# Patient Record
Sex: Female | Born: 2000 | Race: White | Hispanic: No | Marital: Single | Smoking: Never smoker
Health system: Southern US, Community
[De-identification: ages and names within clinical notes are randomized; demographics above are authoritative.]

## PROBLEM LIST (undated history)

## (undated) DIAGNOSIS — Z789 Other specified health status: Secondary | ICD-10-CM

## (undated) DIAGNOSIS — F909 Attention-deficit hyperactivity disorder, unspecified type: Secondary | ICD-10-CM

## (undated) HISTORY — DX: Attention-deficit hyperactivity disorder, unspecified type: F90.9

## (undated) HISTORY — DX: Other specified health status: Z78.9

## (undated) HISTORY — PX: NO PAST SURGERIES: SHX2092

---
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
## 2020-01-21 ENCOUNTER — Ambulatory Visit: Payer: BC Managed Care – PPO

## 2020-01-26 ENCOUNTER — Encounter: Payer: BC Managed Care – PPO | Admitting: Obstetrics and Gynecology

## 2020-01-26 ENCOUNTER — Ambulatory Visit: Payer: BC Managed Care – PPO

## 2020-01-27 ENCOUNTER — Other Ambulatory Visit: Payer: Self-pay

## 2020-01-27 ENCOUNTER — Ambulatory Visit (INDEPENDENT_AMBULATORY_CARE_PROVIDER_SITE_OTHER): Payer: BC Managed Care – PPO | Admitting: Advanced Practice Midwife

## 2020-01-27 VITALS — BP 105/65 | HR 104 | Temp 98.2°F | Wt 220.8 lb

## 2020-01-27 DIAGNOSIS — Z3403 Encounter for supervision of normal first pregnancy, third trimester: Secondary | ICD-10-CM

## 2020-01-27 DIAGNOSIS — Z3A28 28 weeks gestation of pregnancy: Secondary | ICD-10-CM

## 2020-01-27 DIAGNOSIS — O98513 Other viral diseases complicating pregnancy, third trimester: Secondary | ICD-10-CM

## 2020-01-27 DIAGNOSIS — Z3400 Encounter for supervision of normal first pregnancy, unspecified trimester: Secondary | ICD-10-CM | POA: Diagnosis not present

## 2020-01-27 DIAGNOSIS — Z6711 Type A blood, Rh negative: Secondary | ICD-10-CM | POA: Diagnosis not present

## 2020-01-27 DIAGNOSIS — U071 COVID-19: Secondary | ICD-10-CM

## 2020-01-27 DIAGNOSIS — Z23 Encounter for immunization: Secondary | ICD-10-CM

## 2020-01-27 MED ORDER — RHO D IMMUNE GLOBULIN 1500 UNIT/2ML IJ SOSY
300.0000 ug | PREFILLED_SYRINGE | Freq: Once | INTRAMUSCULAR | Status: AC
  Administered 2020-01-27: 300 ug via INTRAMUSCULAR

## 2020-01-27 NOTE — Patient Instructions (Signed)
Fetal Movement Counts Patient Name: ________________________________________________ Patient Due Date: ____________________ What is a fetal movement count?  A fetal movement count is the number of times that you feel your baby move during a certain amount of time. This may also be called a fetal kick count. A fetal movement count is recommended for every pregnant woman. You may be asked to start counting fetal movements as early as week 28 of your pregnancy. Pay attention to when your baby is most active. You may notice your baby's sleep and wake cycles. You may also notice things that make your baby move more. You should do a fetal movement count:  When your baby is normally most active.  At the same time each day. A good time to count movements is while you are resting, after having something to eat and drink. How do I count fetal movements? 1. Find a quiet, comfortable area. Sit, or lie down on your side. 2. Write down the date, the start time and stop time, and the number of movements that you felt between those two times. Take this information with you to your health care visits. 3. Write down your start time when you feel the first movement. 4. Count kicks, flutters, swishes, rolls, and jabs. You should feel at least 10 movements. 5. You may stop counting after you have felt 10 movements, or if you have been counting for 2 hours. Write down the stop time. 6. If you do not feel 10 movements in 2 hours, contact your health care provider for further instructions. Your health care provider may want to do additional tests to assess your baby's well-being. Contact a health care provider if:  You feel fewer than 10 movements in 2 hours.  Your baby is not moving like he or she usually does. Date: ____________ Start time: ____________ Stop time: ____________ Movements: ____________ Date: ____________ Start time: ____________ Stop time: ____________ Movements: ____________ Date: ____________  Start time: ____________ Stop time: ____________ Movements: ____________ Date: ____________ Start time: ____________ Stop time: ____________ Movements: ____________ Date: ____________ Start time: ____________ Stop time: ____________ Movements: ____________ Date: ____________ Start time: ____________ Stop time: ____________ Movements: ____________ Date: ____________ Start time: ____________ Stop time: ____________ Movements: ____________ Date: ____________ Start time: ____________ Stop time: ____________ Movements: ____________ Date: ____________ Start time: ____________ Stop time: ____________ Movements: ____________ This information is not intended to replace advice given to you by your health care provider. Make sure you discuss any questions you have with your health care provider. Document Revised: 09/10/2018 Document Reviewed: 09/10/2018 Elsevier Patient Education  2020 [employer]vier Inc[employer]  

## 2020-01-27 NOTE — Progress Notes (Signed)
   PRENATAL VISIT NOTE  Subjective:  Kristy Scott is a 19 y.o. G1P0 at [redacted]w[redacted]d being seen today for ongoing prenatal care.  She is currently monitored for the following issues for this low-risk pregnancy and has Supervision of normal first pregnancy, antepartum on their problem list.  Patient reports no complaints. She has very recently recovered from COVID and is feeling much stronger.  Contractions: Not present. Vag. Bleeding: None.  Movement: Present. Denies leaking of fluid.   The following portions of the patient's history were reviewed and updated as appropriate: allergies, current medications, past family history, past medical history, past social history, past surgical history and problem list. Problem list updated.  Objective:   Vitals:   01/27/20 0915  BP: 105/65  Pulse: (!) 104  Temp: 98.2 F (36.8 C)  Weight: 220 lb 12.8 oz (100.2 kg)    Fetal Status: Fetal Heart Rate (bpm): 151 Fundal Height: 28 cm Movement: Present     General:  Alert, oriented and cooperative. Patient is in no acute distress.  Skin: Skin is warm and dry. No rash noted.   Cardiovascular: Normal heart rate noted  Respiratory: Normal respiratory effort, no problems with respiration noted  Abdomen: Soft, gravid, appropriate for gestational age.  Pain/Pressure: Absent     Pelvic: Cervical exam deferred        Extremities: Normal range of motion.  Edema: None  Mental Status: Normal mood and affect. Normal behavior. Normal judgment and thought content.   Assessment and Plan:  Pregnancy: G1P0 at [redacted]w[redacted]d  1. Supervision of normal first pregnancy, antepartum - LOB, routine care - Returning next week for GTT/lab only - rho (d) immune globulin (RHIG/RHOPHYLAC) injection 300 mcg - Glucose Tolerance, 2 Hours w/1 Hour; Future - HIV Antibody (routine testing w rflx); Future - RPR; Future - CBC; Future  2. COVID-19 affecting pregnancy in third trimester - S/p quarantine period. No residual complaints  3.  Need for tetanus, diphtheria, and acellular pertussis (Tdap) vaccine in patient of adolescent age or older  - Tdap vaccine greater than or equal to 7yo IM  4. Need for immunization against influenza  - Flu Vaccine QUAD 36+ mos IM  5. Type A blood, Rh negative  - rho (d) immune globulin (RHIG/RHOPHYLAC) injection 300 mcg  Preterm labor symptoms and general obstetric precautions including but not limited to vaginal bleeding, contractions, leaking of fluid and fetal movement were reviewed in detail with the patient. Please refer to After Visit Summary for other counseling recommendations.    Future Appointments  Date Time Provider Department Center  02/01/2020  8:30 AM [facility] [place] [facility] None  02/14/2020  2:15 PM [facility] NURSE [facility]  02/14/2020  2:30 PM [facility] US3 [facility]  02/17/2020  8:10 AM [name], CNM [facility] None    [place], [place]

## 2020-02-01 ENCOUNTER — Other Ambulatory Visit (INDEPENDENT_AMBULATORY_CARE_PROVIDER_SITE_OTHER): Payer: BC Managed Care – PPO | Admitting: *Deleted

## 2020-02-01 ENCOUNTER — Other Ambulatory Visit: Payer: Self-pay

## 2020-02-01 DIAGNOSIS — Z3400 Encounter for supervision of normal first pregnancy, unspecified trimester: Secondary | ICD-10-CM

## 2020-02-01 NOTE — Progress Notes (Signed)
   Patient in clinic to complete 28 week labs.  ° °Florella Mcneese L, RN ° °

## 2020-02-02 LAB — GLUCOSE TOLERANCE, 2 HOURS W/ 1HR
Glucose, 1 hour: 122 mg/dL (ref 65–179)
Glucose, 2 hour: 89 mg/dL (ref 65–152)
Glucose, Fasting: 74 mg/dL (ref 65–91)

## 2020-02-02 LAB — CBC
Hematocrit: 35.8 % (ref 34.0–46.6)
Hemoglobin: 12.3 g/dL (ref 11.1–15.9)
MCH: 31.4 pg (ref 26.6–33.0)
MCHC: 34.4 g/dL (ref 31.5–35.7)
MCV: 91 fL (ref 79–97)
Platelets: 222 10*3/uL (ref 150–450)
RBC: 3.92 x10E6/uL (ref 3.77–5.28)
RDW: 12 % (ref 11.7–15.4)
WBC: 12.2 10*3/uL — ABNORMAL HIGH (ref 3.4–10.8)

## 2020-02-02 LAB — RPR: RPR Ser Ql: NONREACTIVE

## 2020-02-02 LAB — HIV ANTIBODY (ROUTINE TESTING W REFLEX): HIV Screen 4th Generation wRfx: NONREACTIVE

## 2020-02-05 NOTE — L&D Delivery Note (Signed)
OB/GYN Faculty Practice Delivery Note  Kristy Scott is a 20 y.o. G1P1001 s/p SVD at [redacted]w[redacted]d. She was admitted for IOL for post dates.   ROM: 14h 38m with meconium fluid GBS Status:  Positive/-- (02/17 0846) Maximum Maternal Temperature: 99.7  Labor Progress: . Initial SVE: 1cm. She received FB, cytotec, and pitocin. AROM for meconium. Patient had cat II strip intermittently. She then progressed to complete.   Delivery Date/Time: 1840 on 3/23 Delivery: Called to room and patient was complete and pushing. Head with slow crown direct OA. With next push a shoulder dystocia was identified. Placed in [place] and suprapubic pressure applied without relief. Relieved with woodscrew maneuver. Approx 20 second Shoulder dystocia. Infant without spontaneous cry and poor tone. Cord clamped x 2 without delay and handed to NICU team. Cord gas drawn. Cord blood drawn.  Placenta delivered spontaneously with gentle cord traction. Fundus firm with massage and Pitocin. Labia, perineum, vagina, and cervix inspected inspected with right vaginal laceration, repaired.  Baby Weight: pending  Cord gas: pH 7.255, pCO2 52 Bicarb 22.3   Placenta: Sent to L&D Complications: 20 sec shoulder dystocia Lacerations: right vaginal EBL: 400 mL Analgesia: Epidural   Infant:  APGAR (1 MIN): 8   APGAR (5 MINS): 8   APGAR (10 MINS):     Casper Harrison, MD [facility] Fellow, [facility] for [facility], [facility] 04/26/2020, 7:09 PM

## 2020-02-07 ENCOUNTER — Telehealth: Payer: Self-pay | Admitting: Licensed Clinical Social Worker

## 2020-02-07 NOTE — Telephone Encounter (Signed)
Left message regarding registering for community support

## 2020-02-14 ENCOUNTER — Ambulatory Visit: Payer: BC Managed Care – PPO | Attending: Obstetrics

## 2020-02-14 ENCOUNTER — Ambulatory Visit: Payer: BC Managed Care – PPO | Admitting: *Deleted

## 2020-02-14 ENCOUNTER — Other Ambulatory Visit: Payer: Self-pay

## 2020-02-14 ENCOUNTER — Encounter: Payer: Self-pay | Admitting: *Deleted

## 2020-02-14 DIAGNOSIS — R638 Other symptoms and signs concerning food and fluid intake: Secondary | ICD-10-CM | POA: Diagnosis present

## 2020-02-14 DIAGNOSIS — O99212 Obesity complicating pregnancy, second trimester: Secondary | ICD-10-CM | POA: Diagnosis not present

## 2020-02-14 DIAGNOSIS — Z3400 Encounter for supervision of normal first pregnancy, unspecified trimester: Secondary | ICD-10-CM | POA: Diagnosis present

## 2020-02-14 DIAGNOSIS — E669 Obesity, unspecified: Secondary | ICD-10-CM

## 2020-02-14 DIAGNOSIS — O360130 Maternal care for anti-D [Rh] antibodies, third trimester, not applicable or unspecified: Secondary | ICD-10-CM

## 2020-02-14 DIAGNOSIS — O99213 Obesity complicating pregnancy, third trimester: Secondary | ICD-10-CM | POA: Diagnosis not present

## 2020-02-14 DIAGNOSIS — Z362 Encounter for other antenatal screening follow-up: Secondary | ICD-10-CM

## 2020-02-14 DIAGNOSIS — Z3A31 31 weeks gestation of pregnancy: Secondary | ICD-10-CM | POA: Diagnosis not present

## 2020-02-17 ENCOUNTER — Ambulatory Visit (INDEPENDENT_AMBULATORY_CARE_PROVIDER_SITE_OTHER): Payer: Medicaid Other | Admitting: Obstetrics and Gynecology

## 2020-02-17 ENCOUNTER — Encounter: Payer: Self-pay | Admitting: Obstetrics and Gynecology

## 2020-02-17 ENCOUNTER — Other Ambulatory Visit: Payer: Self-pay

## 2020-02-17 VITALS — BP 126/80 | HR 93 | Temp 97.6°F | Wt 224.8 lb

## 2020-02-17 DIAGNOSIS — Z3400 Encounter for supervision of normal first pregnancy, unspecified trimester: Secondary | ICD-10-CM

## 2020-02-17 DIAGNOSIS — Z3A31 31 weeks gestation of pregnancy: Secondary | ICD-10-CM

## 2020-02-17 NOTE — Progress Notes (Signed)
   LOW-RISK PREGNANCY OFFICE VISIT Patient name: Kristy Scott MRN [id number]  Date of birth: May 07, 2000 Chief Complaint:   Routine Prenatal Visit  History of Present Illness:   Kristy Scott is a 20 y.o. G1P0 female at [redacted]w[redacted]d with an Estimated Date of Delivery: 04/17/20 being seen today for ongoing management of a low-risk pregnancy.  Today she reports no complaints. She had COVID-19 "about a month ago." She was not able to get booster shot since her last vaccine was >6 months ago.  Contractions: Not present. Vag. Bleeding: None.  Movement: Present. denies leaking of fluid. Review of Systems:   Pertinent items are noted in HPI Denies abnormal vaginal discharge w/ itching/odor/irritation, headaches, visual changes, shortness of breath, chest pain, abdominal pain, severe nausea/vomiting, or problems with urination or bowel movements unless otherwise stated above. Pertinent History Reviewed:  Reviewed past medical,surgical, social, obstetrical and family history.  Reviewed problem list, medications and allergies. Physical Assessment:   Vitals:   02/17/20 0815  BP: 126/80  Pulse: 93  Temp: 97.6 F (36.4 C)  Weight: 224 lb 12.8 oz (102 kg)  Body mass index is 36.28 kg/m.        Physical Examination:   General appearance: Well appearing, and in no distress  Mental status: Alert, oriented to person, place, and time  Skin: Warm & dry  Cardiovascular: Normal heart rate noted  Respiratory: Normal respiratory effort, no distress  Abdomen: Soft, gravid, nontender  Pelvic: Cervical exam deferred         Extremities: Edema: None  Fetal Status: Fetal Heart Rate (bpm): 153   Movement: Present    No results found for this or any previous visit (from the past 24 hour(s)).  Assessment & Plan:  1) Low-risk pregnancy G1P0 at [redacted]w[redacted]d with an Estimated Date of Delivery: 04/17/20   2) Supervision of normal first pregnancy, antepartum - Anticipatory guidance for GBS screening at 36 wks. Explained  the test is important to be done at this time in pregnancy to ensure adequate treatment at the time of delivery. Explained that a positive result does not mean any harm to her, but can be harmful to the baby. Meaning that if baby is exposed to the bacteria for too long without antibiotics, the bay has the potential to develop pneumonia, septicemia, or spinal meningitis and could end up in the NICU. Also, explained that a cervical exam may be performed at the time of testing to get a baseline cervical check and make sure there is no preterm cervical dilation.  3) [redacted] weeks gestation of pregnancy    Meds: No orders of the defined types were placed in this encounter.  Labs/procedures today: none  Plan:  Continue routine obstetrical care   Reviewed: Preterm labor symptoms and general obstetric precautions including but not limited to vaginal bleeding, contractions, leaking of fluid and fetal movement were reviewed in detail with the patient.  All questions were answered. Has home bp cuff from mom. Check bp weekly, let us know if >140/90.   Follow-up: Return in about 4 weeks (around 03/16/2020) for Return OB - My Chart video.  No orders of the defined types were placed in this encounter.  [name] MSN, CNM 02/17/2020 8:21 AM

## 2020-03-23 ENCOUNTER — Ambulatory Visit (INDEPENDENT_AMBULATORY_CARE_PROVIDER_SITE_OTHER): Payer: Medicaid Other | Admitting: Obstetrics and Gynecology

## 2020-03-23 ENCOUNTER — Other Ambulatory Visit (HOSPITAL_COMMUNITY)
Admission: RE | Admit: 2020-03-23 | Discharge: 2020-03-23 | Disposition: A | Discharge status: Home / Self Care | Payer: Medicaid Other | Source: Ambulatory Visit | Attending: Obstetrics and Gynecology | Admitting: Obstetrics and Gynecology

## 2020-03-23 ENCOUNTER — Encounter: Payer: Self-pay | Admitting: Obstetrics and Gynecology

## 2020-03-23 ENCOUNTER — Other Ambulatory Visit: Payer: Self-pay

## 2020-03-23 VITALS — BP 108/74 | HR 105 | Temp 98.2°F | Wt 237.2 lb

## 2020-03-23 DIAGNOSIS — Z3400 Encounter for supervision of normal first pregnancy, unspecified trimester: Secondary | ICD-10-CM | POA: Insufficient documentation

## 2020-03-23 DIAGNOSIS — Z3A36 36 weeks gestation of pregnancy: Secondary | ICD-10-CM

## 2020-03-23 NOTE — Progress Notes (Signed)
   LOW-RISK PREGNANCY OFFICE VISIT Patient name: Kristy Scott MRN [id number]  Date of birth: Jun 15, 2000 Chief Complaint:   Routine Prenatal Visit  History of Present Illness:   Kristy Scott is a 20 y.o. G1P0 female at [redacted]w[redacted]d with an Estimated Date of Delivery: 04/17/20 being seen today for ongoing management of a low-risk pregnancy.  Today she reports no complaints. Contractions: Not present. Vag. Bleeding: None.  Movement: Present. denies leaking of fluid. Review of Systems:   Pertinent items are noted in HPI Denies abnormal vaginal discharge w/ itching/odor/irritation, headaches, visual changes, shortness of breath, chest pain, abdominal pain, severe nausea/vomiting, or problems with urination or bowel movements unless otherwise stated above. Pertinent History Reviewed:  Reviewed past medical,surgical, social, obstetrical and family history.  Reviewed problem list, medications and allergies. Physical Assessment:   Vitals:   03/23/20 0815  BP: 108/74  Pulse: (!) 105  Temp: 98.2 F (36.8 C)  Weight: 237 lb 3.2 oz (107.6 kg)  Body mass index is 38.29 kg/m.        Physical Examination:   General appearance: Well appearing, and in no distress  Mental status: Alert, oriented to person, place, and time  Skin: Warm & dry  Cardiovascular: Normal heart rate noted  Respiratory: Normal respiratory effort, no distress  Abdomen: Soft, gravid, nontender  Pelvic: Cervical exam performed         Extremities: Edema: Other (Comment)  Fetal Status: Fetal Heart Rate (bpm): 145 Fundal Height: 36 cm Movement: Present Presentation: Vertex    Assessment & Plan:  1) Low-risk pregnancy G1P0 at [redacted]w[redacted]d with an Estimated Date of Delivery: 04/17/20   2) Supervision of normal first pregnancy, antepartum  - Cervicovaginal ancillary only( Needles),  - Strep Gp B Culture+Rflx  3) [redacted] weeks gestation of pregnancy    Meds: No orders of the defined types were placed in this  encounter.  Labs/procedures today: GC/CT, GBS and wet prep  Plan:  Continue routine obstetrical care   Reviewed: Preterm labor symptoms and general obstetric precautions including but not limited to vaginal bleeding, contractions, leaking of fluid and fetal movement were reviewed in detail with the patient.  All questions were answered. Has home bp cuff. Check bp weekly, let us know if >140/90.   Follow-up: Return in about 2 weeks (around 04/06/2020) for Return OB visit.  Orders Placed This Encounter  Procedures  . Strep Gp B Culture+Rflx   [name] MSN, CNM 03/23/2020

## 2020-03-24 LAB — CERVICOVAGINAL ANCILLARY ONLY
Bacterial Vaginitis (gardnerella): NEGATIVE
Candida Glabrata: NEGATIVE
Candida Vaginitis: NEGATIVE
Chlamydia: NEGATIVE
Comment: NEGATIVE
Comment: NEGATIVE
Comment: NEGATIVE
Comment: NEGATIVE
Comment: NEGATIVE
Comment: NORMAL
Neisseria Gonorrhea: NEGATIVE
Trichomonas: NEGATIVE

## 2020-03-29 LAB — STREP GP B SUSCEPTIBILITY

## 2020-03-29 LAB — STREP GP B CULTURE+RFLX: Strep Gp B Culture+Rflx: POSITIVE — AB

## 2020-03-30 ENCOUNTER — Encounter: Payer: Self-pay | Admitting: Obstetrics & Gynecology

## 2020-03-30 DIAGNOSIS — O99820 Streptococcus B carrier state complicating pregnancy: Secondary | ICD-10-CM | POA: Insufficient documentation

## 2020-04-06 ENCOUNTER — Other Ambulatory Visit: Payer: Self-pay

## 2020-04-06 ENCOUNTER — Ambulatory Visit (INDEPENDENT_AMBULATORY_CARE_PROVIDER_SITE_OTHER): Payer: Medicaid Other | Admitting: Obstetrics and Gynecology

## 2020-04-06 VITALS — BP 120/85 | HR 103 | Temp 98.3°F | Wt 244.2 lb

## 2020-04-06 DIAGNOSIS — Z3400 Encounter for supervision of normal first pregnancy, unspecified trimester: Secondary | ICD-10-CM

## 2020-04-06 DIAGNOSIS — Z3A38 38 weeks gestation of pregnancy: Secondary | ICD-10-CM

## 2020-04-06 NOTE — Patient Instructions (Signed)

## 2020-04-06 NOTE — Progress Notes (Signed)
   LOW-RISK PREGNANCY OFFICE VISIT Patient name: Kristy Scott MRN [id number]  Date of birth: Oct 26, 2000 Chief Complaint:   Routine Prenatal Visit  History of Present Illness:   Kristy Scott is a 20 y.o. G1P0 female at [redacted]w[redacted]d with an Estimated Date of Delivery: 04/17/20 being seen today for ongoing management of a low-risk pregnancy.  Today she reports no complaints. Contractions: Not present. Vag. Bleeding: None.  Movement: Present. denies leaking of fluid. Review of Systems:   Pertinent items are noted in HPI Denies abnormal vaginal discharge w/ itching/odor/irritation, headaches, visual changes, shortness of breath, chest pain, abdominal pain, severe nausea/vomiting, or problems with urination or bowel movements unless otherwise stated above. Pertinent History Reviewed:  Reviewed past medical,surgical, social, obstetrical and family history.  Reviewed problem list, medications and allergies. Physical Assessment:   Vitals:   04/06/20 0933  BP: 120/85  Pulse: (!) 103  Temp: 98.3 F (36.8 C)  Weight: 244 lb 3.2 oz (110.8 kg)  Body mass index is 39.41 kg/m.        Physical Examination:   General appearance: Well appearing, and in no distress  Mental status: Alert, oriented to person, place, and time  Skin: Warm & dry  Cardiovascular: Normal heart rate noted  Respiratory: Normal respiratory effort, no distress  Abdomen: Soft, gravid, nontender  Pelvic: Cervical exam deferred         Extremities: Edema: Trace  Fetal Status: Fetal Heart Rate (bpm): 145 Fundal Height: 42 cm Movement: Present Presentation: Vertex  No results found for this or any previous visit (from the past 24 hour(s)).  Assessment & Plan:  1) Low-risk pregnancy G1P0 at [redacted]w[redacted]d with an Estimated Date of Delivery: 04/17/20   2) Supervision of normal first pregnancy, antepartum - Information provided on signs of labor   3) [redacted] weeks gestation of pregnancy    Meds: No orders of the defined types were placed  in this encounter.  Labs/procedures today: none  Plan:  Continue routine obstetrical care   Reviewed: Term labor symptoms and general obstetric precautions including but not limited to vaginal bleeding, contractions, leaking of fluid and fetal movement were reviewed in detail with the patient.  All questions were answered. Has home bp cuff. Check bp weekly, let us know if >140/90.   Follow-up: Return in about 1 week (around 04/13/2020) for Return OB visit.  No orders of the defined types were placed in this encounter.  [name] MSN, CNM 04/06/2020 9:42 AM

## 2020-04-12 ENCOUNTER — Ambulatory Visit (INDEPENDENT_AMBULATORY_CARE_PROVIDER_SITE_OTHER): Payer: Medicaid Other | Admitting: Obstetrics and Gynecology

## 2020-04-12 ENCOUNTER — Other Ambulatory Visit: Payer: Self-pay | Admitting: Obstetrics and Gynecology

## 2020-04-12 ENCOUNTER — Other Ambulatory Visit: Payer: Self-pay

## 2020-04-12 VITALS — BP 116/79 | HR 109 | Temp 98.0°F | Wt 243.0 lb

## 2020-04-12 DIAGNOSIS — Z3A39 39 weeks gestation of pregnancy: Secondary | ICD-10-CM

## 2020-04-12 DIAGNOSIS — Z3400 Encounter for supervision of normal first pregnancy, unspecified trimester: Secondary | ICD-10-CM

## 2020-04-12 NOTE — Progress Notes (Signed)
   LOW-RISK PREGNANCY OFFICE VISIT Patient name: Kristy Scott MRN [id number]  Date of birth: 03-12-2000 Chief Complaint:   Routine Prenatal Visit  History of Present Illness:   Kristy Scott is a 20 y.o. G1P0 female at [redacted]w[redacted]d with an Estimated Date of Delivery: 04/17/20 being seen today for ongoing management of a low-risk pregnancy.  Today she reports occasional contractions. Desires to induced at 41 weeks. Contractions: Not present. Vag. Bleeding: None.  Movement: Present. denies leaking of fluid. Review of Systems:   Pertinent items are noted in HPI Denies abnormal vaginal discharge w/ itching/odor/irritation, headaches, visual changes, shortness of breath, chest pain, abdominal pain, severe nausea/vomiting, or problems with urination or bowel movements unless otherwise stated above. Pertinent History Reviewed:  Reviewed past medical,surgical, social, obstetrical and family history.  Reviewed problem list, medications and allergies. Physical Assessment:   Vitals:   04/12/20 1456  BP: 116/79  Pulse: (!) 109  Temp: 98 F (36.7 C)  Weight: 243 lb (110.2 kg)  Body mass index is 39.22 kg/m.        Physical Examination:   General appearance: Well appearing, and in no distress  Mental status: Alert, oriented to person, place, and time  Skin: Warm & dry  Cardiovascular: Normal heart rate noted  Respiratory: Normal respiratory effort, no distress  Abdomen: Soft, gravid, nontender  Pelvic: Cervical exam performed  Dilation: 1 Effacement (%): 50 Station: Ballotable,-3  Extremities: Edema: None  Fetal Status: Fetal Heart Rate (bpm): 146 Fundal Height: 41 cm Movement: Present Presentation: Vertex  Assessment & Plan:  1) Low-risk pregnancy G1P0 at [redacted]w[redacted]d with an Estimated Date of Delivery: 04/17/20   2) Supervision of normal first pregnancy, antepartum - IOL scheduled for 04/25/2020 at midnight - Advised that someone from L&D (usually the charge RN will call her and instruct her  when to come to the hospital - IOL orders entered in Epic  3) [redacted] weeks gestation of pregnancy    Meds: No orders of the defined types were placed in this encounter.  Labs/procedures today: cervical exam  Plan:  Continue routine obstetrical care   Reviewed: Term labor symptoms and general obstetric precautions including but not limited to vaginal bleeding, contractions, leaking of fluid and fetal movement were reviewed in detail with the patient.  All questions were answered. Has home bp cuff. Check bp weekly, let us know if >140/90.   Follow-up: Return in about 1 week (around 04/19/2020) for Return OB visit.  No orders of the defined types were placed in this encounter.  [name] MSN, CNM 04/12/2020 4:46 PM

## 2020-04-13 ENCOUNTER — Telehealth: Payer: Self-pay | Admitting: Licensed Clinical Social Worker

## 2020-04-13 NOTE — Telephone Encounter (Signed)
Called pt regarding contraception counseling left message for callback

## 2020-04-15 ENCOUNTER — Encounter: Payer: Self-pay | Admitting: Obstetrics and Gynecology

## 2020-04-17 ENCOUNTER — Telehealth (HOSPITAL_COMMUNITY): Payer: Self-pay | Admitting: *Deleted

## 2020-04-17 NOTE — Telephone Encounter (Signed)
Preadmission screen  

## 2020-04-18 ENCOUNTER — Other Ambulatory Visit: Payer: Self-pay | Admitting: Advanced Practice Midwife

## 2020-04-18 ENCOUNTER — Encounter (HOSPITAL_COMMUNITY): Payer: Self-pay | Admitting: *Deleted

## 2020-04-18 ENCOUNTER — Telehealth (HOSPITAL_COMMUNITY): Payer: Self-pay | Admitting: *Deleted

## 2020-04-18 NOTE — Telephone Encounter (Signed)
Preadmission screen  

## 2020-04-19 ENCOUNTER — Other Ambulatory Visit: Payer: Self-pay

## 2020-04-19 ENCOUNTER — Ambulatory Visit (INDEPENDENT_AMBULATORY_CARE_PROVIDER_SITE_OTHER): Payer: Medicaid Other | Admitting: Advanced Practice Midwife

## 2020-04-19 ENCOUNTER — Encounter: Payer: Self-pay | Admitting: Advanced Practice Midwife

## 2020-04-19 VITALS — BP 121/81 | HR 116 | Temp 97.7°F | Wt 245.0 lb

## 2020-04-19 DIAGNOSIS — Z3A40 40 weeks gestation of pregnancy: Secondary | ICD-10-CM

## 2020-04-19 DIAGNOSIS — Z3480 Encounter for supervision of other normal pregnancy, unspecified trimester: Secondary | ICD-10-CM

## 2020-04-19 NOTE — Progress Notes (Signed)
   PRENATAL VISIT NOTE  Subjective:  Kristy Scott is a 20 y.o. G1P0 at [redacted]w[redacted]d being seen today for ongoing prenatal care.  She is currently monitored for the following issues for this low-risk pregnancy and has Supervision of normal first pregnancy, antepartum and Group B Streptococcus carrier, +RV culture, currently pregnant on their problem list.  Patient reports no complaints.  Contractions: Not present. Vag. Bleeding: None.  Movement: Present. Denies leaking of fluid.   The following portions of the patient's history were reviewed and updated as appropriate: allergies, current medications, past family history, past medical history, past social history, past surgical history and problem list.   Objective:   Vitals:   04/19/20 1606  BP: 121/81  Pulse: (!) 116  Temp: 97.7 F (36.5 C)  Weight: 245 lb (111.1 kg)    Fetal Status: Fetal Heart Rate (bpm): 155   Movement: Present  Presentation: Vertex  General:  Alert, oriented and cooperative. Patient is in no acute distress.  Skin: Skin is warm and dry. No rash noted.   Cardiovascular: Normal heart rate noted  Respiratory: Normal respiratory effort, no problems with respiration noted  Abdomen: Soft, gravid, appropriate for gestational age.  Pain/Pressure: Present     Pelvic: Cervical exam performed in the presence of a chaperone Dilation: 1 Effacement (%): 60,70 Station: -2  Extremities: Normal range of motion.  Edema: Trace  Mental Status: Normal mood and affect. Normal behavior. Normal judgment and thought content.   Assessment and Plan:  Pregnancy: G1P0 at [redacted]w[redacted]d 1. Supervision of other normal pregnancy, antepartum   2. [redacted] weeks gestation of pregnancy - IOL scheduled for 3/22  - Needs BPP/NST, unsure if we can get an appt. Will try to get NST here in [place].   Term labor symptoms and general obstetric precautions including but not limited to vaginal bleeding, contractions, leaking of fluid and fetal movement were  reviewed in detail with the patient. Please refer to After Visit Summary for other counseling recommendations.   No follow-ups on file.  Future Appointments  Date Time Provider Department Center  04/24/2020 10:30 AM [facility]ENING [facility] None  04/25/2020 12:00 AM [facility] ROOM [facility] None    Thressa Sheller DNP, CNM  04/19/20  4:35 PM

## 2020-04-21 ENCOUNTER — Other Ambulatory Visit: Payer: Self-pay

## 2020-04-21 ENCOUNTER — Ambulatory Visit: Payer: Medicaid Other | Attending: Obstetrics and Gynecology | Admitting: *Deleted

## 2020-04-21 ENCOUNTER — Ambulatory Visit: Payer: Medicaid Other | Admitting: *Deleted

## 2020-04-21 ENCOUNTER — Encounter: Payer: Self-pay | Admitting: *Deleted

## 2020-04-21 DIAGNOSIS — Z3A40 40 weeks gestation of pregnancy: Secondary | ICD-10-CM | POA: Insufficient documentation

## 2020-04-21 DIAGNOSIS — O99820 Streptococcus B carrier state complicating pregnancy: Secondary | ICD-10-CM

## 2020-04-21 DIAGNOSIS — O480 Post-term pregnancy: Secondary | ICD-10-CM | POA: Diagnosis not present

## 2020-04-21 DIAGNOSIS — Z3400 Encounter for supervision of normal first pregnancy, unspecified trimester: Secondary | ICD-10-CM

## 2020-04-21 NOTE — Procedures (Signed)
Rosell Khouri Feb 16, 2000 [redacted]w[redacted]d  Fetus A Non-Stress Test Interpretation for 04/21/20  Indication: Postdates  Fetal Heart Rate A Mode: External Baseline Rate (A): 145 bpm Variability: Moderate Accelerations: 15 x 15 Decelerations: None Multiple birth?: No  Uterine Activity Mode: Palpation,Toco Contraction Frequency (min): 4-8 Contraction Duration (sec): 50-60 Contraction Quality: Mild Resting Tone Palpated: Relaxed Resting Time: Adequate  Interpretation (Fetal Testing) Nonstress Test Interpretation: Reactive Overall Impression: Reassuring for gestational age Comments: Dr. Grace Bushy reviewed tracing.

## 2020-04-24 ENCOUNTER — Other Ambulatory Visit (HOSPITAL_COMMUNITY)
Admission: RE | Admit: 2020-04-24 | Discharge: 2020-04-24 | Disposition: A | Discharge status: Home / Self Care | Payer: BC Managed Care – PPO | Source: Ambulatory Visit | Attending: Family Medicine | Admitting: Family Medicine

## 2020-04-24 DIAGNOSIS — Z20822 Contact with and (suspected) exposure to covid-19: Secondary | ICD-10-CM | POA: Insufficient documentation

## 2020-04-24 DIAGNOSIS — Z01812 Encounter for preprocedural laboratory examination: Secondary | ICD-10-CM | POA: Insufficient documentation

## 2020-04-25 ENCOUNTER — Other Ambulatory Visit: Payer: Self-pay

## 2020-04-25 ENCOUNTER — Inpatient Hospital Stay (HOSPITAL_COMMUNITY): Payer: BC Managed Care – PPO

## 2020-04-25 ENCOUNTER — Encounter (HOSPITAL_COMMUNITY): Payer: Self-pay | Admitting: Family Medicine

## 2020-04-25 ENCOUNTER — Inpatient Hospital Stay (HOSPITAL_COMMUNITY)
Admission: AD | Admit: 2020-04-25 | Discharge: 2020-04-28 | DRG: 807 | Disposition: A | Discharge status: Home / Self Care | Payer: BC Managed Care – PPO | Attending: Obstetrics & Gynecology | Admitting: Obstetrics & Gynecology

## 2020-04-25 DIAGNOSIS — Z6791 Unspecified blood type, Rh negative: Secondary | ICD-10-CM

## 2020-04-25 DIAGNOSIS — Z20822 Contact with and (suspected) exposure to covid-19: Secondary | ICD-10-CM | POA: Diagnosis present

## 2020-04-25 DIAGNOSIS — O99824 Streptococcus B carrier state complicating childbirth: Secondary | ICD-10-CM | POA: Diagnosis not present

## 2020-04-25 DIAGNOSIS — Z3A41 41 weeks gestation of pregnancy: Secondary | ICD-10-CM

## 2020-04-25 DIAGNOSIS — O99820 Streptococcus B carrier state complicating pregnancy: Secondary | ICD-10-CM

## 2020-04-25 DIAGNOSIS — O26893 Other specified pregnancy related conditions, third trimester: Secondary | ICD-10-CM | POA: Diagnosis present

## 2020-04-25 DIAGNOSIS — O480 Post-term pregnancy: Secondary | ICD-10-CM | POA: Diagnosis not present

## 2020-04-25 LAB — CBC
HCT: 36.3 % (ref 36.0–46.0)
Hemoglobin: 12.6 g/dL (ref 12.0–15.0)
MCH: 30.1 pg (ref 26.0–34.0)
MCHC: 34.7 g/dL (ref 30.0–36.0)
MCV: 86.8 fL (ref 80.0–100.0)
Platelets: 268 10*3/uL (ref 150–400)
RBC: 4.18 MIL/uL (ref 3.87–5.11)
RDW: 13.2 % (ref 11.5–15.5)
WBC: 11.5 10*3/uL — ABNORMAL HIGH (ref 4.0–10.5)
nRBC: 0 % (ref 0.0–0.2)

## 2020-04-25 LAB — TYPE AND SCREEN
ABO/RH(D): A NEG
Antibody Screen: POSITIVE

## 2020-04-25 LAB — SARS CORONAVIRUS 2 (TAT 6-24 HRS): SARS Coronavirus 2: NEGATIVE

## 2020-04-25 MED ORDER — LACTATED RINGERS IV SOLN: INTRAVENOUS | Status: DC

## 2020-04-25 MED ORDER — MISOPROSTOL 50MCG HALF TABLET
50.0000 ug | ORAL_TABLET | ORAL | Status: DC | PRN
  Administered 2020-04-25 (×2): 50 ug via ORAL
  Filled 2020-04-25 (×2): qty 1

## 2020-04-25 MED ORDER — HYDROXYZINE HCL 50 MG PO TABS: 50.0000 mg | ORAL_TABLET | Freq: Four times a day (QID) | ORAL | Status: DC | PRN

## 2020-04-25 MED ORDER — OXYTOCIN-SODIUM CHLORIDE 30-0.9 UT/500ML-% IV SOLN
2.5000 [IU]/h | INTRAVENOUS | Status: DC
  Administered 2020-04-26: 2.5 [IU]/h via INTRAVENOUS
  Filled 2020-04-25: qty 500

## 2020-04-25 MED ORDER — ONDANSETRON HCL 4 MG/2ML IJ SOLN
4.0000 mg | Freq: Four times a day (QID) | INTRAMUSCULAR | Status: DC | PRN
  Administered 2020-04-26: 4 mg via INTRAVENOUS
  Filled 2020-04-25: qty 2

## 2020-04-25 MED ORDER — LEVONORGESTREL 19.5 MCG/DAY IU IUD: INTRAUTERINE_SYSTEM | Freq: Once | INTRAUTERINE | Status: DC

## 2020-04-25 MED ORDER — LIDOCAINE HCL (PF) 1 % IJ SOLN: 30.0000 mL | INTRAMUSCULAR | Status: DC | PRN

## 2020-04-25 MED ORDER — MISOPROSTOL 25 MCG QUARTER TABLET: 25.0000 ug | ORAL_TABLET | ORAL | Status: DC | PRN

## 2020-04-25 MED ORDER — OXYTOCIN-SODIUM CHLORIDE 30-0.9 UT/500ML-% IV SOLN
1.0000 m[IU]/min | INTRAVENOUS | Status: DC
  Administered 2020-04-26 (×2): 2 m[IU]/min via INTRAVENOUS

## 2020-04-25 MED ORDER — TERBUTALINE SULFATE 1 MG/ML IJ SOLN
0.2500 mg | Freq: Once | INTRAMUSCULAR | Status: DC | PRN
Start: 2020-04-25 — End: 2020-04-26

## 2020-04-25 MED ORDER — FENTANYL CITRATE (PF) 100 MCG/2ML IJ SOLN: 100.0000 ug | INTRAMUSCULAR | Status: DC | PRN

## 2020-04-25 MED ORDER — OXYTOCIN BOLUS FROM INFUSION
333.0000 mL | Freq: Once | INTRAVENOUS | Status: AC
  Administered 2020-04-26: 333 mL via INTRAVENOUS

## 2020-04-25 MED ORDER — LACTATED RINGERS IV SOLN
500.0000 mL | INTRAVENOUS | Status: DC | PRN
  Administered 2020-04-26 (×2): 500 mL via INTRAVENOUS

## 2020-04-25 MED ORDER — ACETAMINOPHEN 325 MG PO TABS
650.0000 mg | ORAL_TABLET | ORAL | Status: DC | PRN
Start: 2020-04-25 — End: 2020-04-26

## 2020-04-25 MED ORDER — VANCOMYCIN HCL IN DEXTROSE 1-5 GM/200ML-% IV SOLN
1000.0000 mg | Freq: Two times a day (BID) | INTRAVENOUS | Status: DC
  Administered 2020-04-25 – 2020-04-26 (×3): 1000 mg via INTRAVENOUS
  Filled 2020-04-25 (×3): qty 200

## 2020-04-25 NOTE — H&P (Addendum)
OBSTETRIC ADMISSION HISTORY AND PHYSICAL  Kristy Scott is a 20 y.o. female G1P0 with IUP at [redacted]w[redacted]d by LMP presenting for postdates induction. She reports +FMs, No LOF, no VB, no blurry vision, headaches or peripheral edema, and RUQ pain.  She plans on breast feeding. She is considering IUD for birth control.  She received her prenatal care at [facility].  Dating: By LMP --->  Estimated Date of Delivery: 04/17/20  Sono:    @[redacted]w[redacted]d , CWD, normal anatomy, cephalic presentation, 1696g, EFW   Prenatal History/Complications: GBS +, Rh-  Past Medical History: Past Medical History:  Diagnosis Date  . Medical history non-contributory     Past Surgical History: Past Surgical History:  Procedure Laterality Date  . NO PAST SURGERIES      Obstetrical History: OB History    Gravida  1   Para      Term      Preterm      AB      Living        SAB      IAB      Ectopic      Multiple      Live Births              Social History Social History   Socioeconomic History  . Marital status: Single    Spouse name: Not on file  . Number of children: Not on file  . Years of education: Not on file  . Highest education level: High school graduate  Occupational History    Comment: [employer]  Tobacco Use  . Smoking status: Never Smoker  . Smokeless tobacco: Never Used  Vaping Use  . Vaping Use: Never used  Substance and Sexual Activity  . Alcohol use: Never  . Drug use: Not Currently    Types: Marijuana    Comment: stopped 3 months ago  . Sexual activity: Yes    Birth control/protection: Pill  Other Topics Concern  . Not on file  Social History Narrative  . Not on file   Social Determinants of Health   Financial Resource Strain: Not on file  Food Insecurity: Not on file  Transportation Needs: Not on file  Physical Activity: Not on file  Stress: Not on file  Social Connections: Not on file    Family History: History reviewed. No pertinent family  history.  Allergies: Allergies  Allergen Reactions  . Penicillins Hives  . Amoxicillin Hives    No history of taking cephalosporins    Medications Prior to Admission  Medication Sig Dispense Refill Last Dose  . Blood Pressure Monitoring (BLOOD PRESSURE MONITOR AUTOMAT) DEVI 1 Device by Does not apply route daily. Automatic blood pressure cuff regular size. To monitor blood pressure regularly at home. ICD-10 code:Z34.90 1 each 0 Past Week at Unknown time  . Prenatal Vit-Fe Fumarate-FA (PRENATAL VITAMINS PO) Take 1 tablet by mouth daily.   Past Week at Unknown time     Review of Systems   All systems reviewed and negative except as stated in HPI  Blood pressure 132/77, pulse 86, temperature 98.7 F (37.1 C), temperature source Oral, resp. rate 18, height 5\' 5"  (1.651 m), weight 111.1 kg, last menstrual period 07/12/2019, SpO2 97 %. General appearance: alert, cooperative and appears stated age Lungs: respiratory effort normal Heart: regular rate and rhythm Abdomen: soft, non-tender; bowel sounds normal Pelvic: as described below Extremities: Homans sign is negative, no sign of DVT Presentation: unsure Fetal monitoringBaseline: 140 bpm and Variability:  Good {> 6 bpm), +accels  Uterine activityNone Dilation: 1 Effacement (%): 50 Station: -2 Exam by:: Dr. Rocco Serene   Prenatal labs: ABO, Rh: --/--/PENDING (03/22 1630) Antibody: PENDING (03/22 1630) Rubella: 3.12 (09/27 1625) RPR: Non Reactive (12/28 0847)  HBsAg: Negative (09/27 1625)  HIV: Non Reactive (12/28 0844)  GBS: Positive/-- (02/17 0846)  1 hr Glucola 122 Genetic screening  n/a Anatomy US normal  Prenatal Transfer Tool  Maternal Diabetes: No Genetic Screening: Declined Maternal Ultrasounds/Referrals: Normal Fetal Ultrasounds or other Referrals:  None Maternal Substance Abuse:  No Significant Maternal Medications:  None Significant Maternal Lab Results: Group B Strep positive and Rh negative  Results for  orders placed or performed during the hospital encounter of 04/25/20 (from the past 24 hour(s))  Type and screen   Collection Time: 04/25/20  4:30 PM  Result Value Ref Range   ABO/RH(D) PENDING    Antibody Screen PENDING    Sample Expiration      04/28/2020,2359 Performed at [facility], [address], [place], [place] [address]     Patient Active Problem List   Diagnosis Date Noted  . Indication for care in labor or delivery 04/25/2020  . Group B Streptococcus carrier, +RV culture, currently pregnant 03/30/2020  . Supervision of normal first pregnancy, antepartum 11/01/2019    Assessment/Plan:  Kristy Scott is a 20 y.o. G1P0 at 108w1d here for postdates induction.  #Labor: 50 mg Cytotec PO given, foley bulb requested by patient and placed with no problems. Will recheck labor progression in a couple hours.  #Pain: Planning on epidural #FWB: Cat 1 #ID: GBS + resistant to clindamycin; vancomycin IV for 24 hours #MOF: breastfeeding, consider lactation consult  #MOC: IUD placement, possibly at post partum visit  Patient is agreeable to plan and all questions were answered.   Jeoffrey Massed, Student-PA  04/25/2020, 5:17 PM  Resident Attestation  I saw and evaluated the patient, performing the key elements of the service.I  personally performed or re-performed the history, physical exam, and medical decision making activities of this service and have verified that the service and findings are accurately documented in the student's note. I developed the management plan that is described in the medical student's note, and I agree with the content, with my edits above.    Derrel Nip, PGY2  OB FELLOW HISTORY AND PHYSICAL ATTESTATION  I have seen and examined this patient; I agree with above documentation in the resident's note.   Marcy Siren, D.O. OB Fellow  04/25/2020, 7:36 PM

## 2020-04-25 NOTE — Plan of Care (Signed)

## 2020-04-26 ENCOUNTER — Inpatient Hospital Stay (HOSPITAL_COMMUNITY): Payer: BC Managed Care – PPO | Admitting: Anesthesiology

## 2020-04-26 ENCOUNTER — Encounter (HOSPITAL_COMMUNITY): Payer: Self-pay

## 2020-04-26 DIAGNOSIS — O480 Post-term pregnancy: Secondary | ICD-10-CM

## 2020-04-26 DIAGNOSIS — O99824 Streptococcus B carrier state complicating childbirth: Secondary | ICD-10-CM

## 2020-04-26 DIAGNOSIS — Z3A41 41 weeks gestation of pregnancy: Secondary | ICD-10-CM

## 2020-04-26 LAB — RPR: RPR Ser Ql: NONREACTIVE

## 2020-04-26 MED ORDER — FERROUS SULFATE 325 (65 FE) MG PO TABS
325.0000 mg | ORAL_TABLET | ORAL | Status: DC
  Administered 2020-04-27: 325 mg via ORAL
  Filled 2020-04-26: qty 1

## 2020-04-26 MED ORDER — PHENYLEPHRINE 40 MCG/ML (10ML) SYRINGE FOR IV PUSH (FOR BLOOD PRESSURE SUPPORT): 80.0000 ug | PREFILLED_SYRINGE | INTRAVENOUS | Status: DC | PRN

## 2020-04-26 MED ORDER — EPHEDRINE 5 MG/ML INJ: 10.0000 mg | INTRAVENOUS | Status: DC | PRN

## 2020-04-26 MED ORDER — SIMETHICONE 80 MG PO CHEW: 80.0000 mg | CHEWABLE_TABLET | ORAL | Status: DC | PRN

## 2020-04-26 MED ORDER — DIPHENHYDRAMINE HCL 25 MG PO CAPS: 25.0000 mg | ORAL_CAPSULE | Freq: Four times a day (QID) | ORAL | Status: DC | PRN

## 2020-04-26 MED ORDER — ONDANSETRON HCL 4 MG/2ML IJ SOLN: 4.0000 mg | INTRAMUSCULAR | Status: DC | PRN

## 2020-04-26 MED ORDER — ONDANSETRON HCL 4 MG PO TABS: 4.0000 mg | ORAL_TABLET | ORAL | Status: DC | PRN

## 2020-04-26 MED ORDER — TRANEXAMIC ACID-NACL 1000-0.7 MG/100ML-% IV SOLN
INTRAVENOUS | Status: AC
  Administered 2020-04-26: 1000 mg via INTRAVENOUS
  Filled 2020-04-26: qty 100

## 2020-04-26 MED ORDER — TETANUS-DIPHTH-ACELL PERTUSSIS 5-2.5-18.5 LF-MCG/0.5 IM SUSY: 0.5000 mL | PREFILLED_SYRINGE | Freq: Once | INTRAMUSCULAR | Status: DC

## 2020-04-26 MED ORDER — SENNOSIDES-DOCUSATE SODIUM 8.6-50 MG PO TABS
2.0000 | ORAL_TABLET | Freq: Every day | ORAL | Status: DC
  Administered 2020-04-27: 2 via ORAL
  Filled 2020-04-26: qty 2

## 2020-04-26 MED ORDER — SOD CITRATE-CITRIC ACID 500-334 MG/5ML PO SOLN
ORAL | Status: AC
  Filled 2020-04-26: qty 15

## 2020-04-26 MED ORDER — ZOLPIDEM TARTRATE 5 MG PO TABS: 5.0000 mg | ORAL_TABLET | Freq: Every evening | ORAL | Status: DC | PRN

## 2020-04-26 MED ORDER — EPHEDRINE 5 MG/ML INJ
10.0000 mg | INTRAVENOUS | Status: DC | PRN
  Administered 2020-04-26: 10 mg via INTRAVENOUS

## 2020-04-26 MED ORDER — LIDOCAINE-EPINEPHRINE (PF) 2 %-1:200000 IJ SOLN
INTRAMUSCULAR | Status: DC | PRN
  Administered 2020-04-26: 5 mL via EPIDURAL

## 2020-04-26 MED ORDER — WITCH HAZEL-GLYCERIN EX PADS: 1.0000 "application " | MEDICATED_PAD | CUTANEOUS | Status: DC | PRN

## 2020-04-26 MED ORDER — LACTATED RINGERS AMNIOINFUSION: INTRAVENOUS | Status: DC

## 2020-04-26 MED ORDER — IBUPROFEN 600 MG PO TABS
600.0000 mg | ORAL_TABLET | Freq: Four times a day (QID) | ORAL | Status: DC
  Administered 2020-04-26 – 2020-04-28 (×6): 600 mg via ORAL
  Filled 2020-04-26 (×6): qty 1

## 2020-04-26 MED ORDER — LACTATED RINGERS IV SOLN
500.0000 mL | Freq: Once | INTRAVENOUS | Status: AC
  Administered 2020-04-26: 500 mL via INTRAVENOUS

## 2020-04-26 MED ORDER — MEDROXYPROGESTERONE ACETATE 150 MG/ML IM SUSP: 150.0000 mg | INTRAMUSCULAR | Status: DC | PRN

## 2020-04-26 MED ORDER — PHENYLEPHRINE 40 MCG/ML (10ML) SYRINGE FOR IV PUSH (FOR BLOOD PRESSURE SUPPORT)
80.0000 ug | PREFILLED_SYRINGE | INTRAVENOUS | Status: DC | PRN
  Administered 2020-04-26: 80 ug via INTRAVENOUS

## 2020-04-26 MED ORDER — EPHEDRINE 5 MG/ML INJ
10.0000 mg | INTRAVENOUS | Status: DC | PRN
  Filled 2020-04-26: qty 10

## 2020-04-26 MED ORDER — ACETAMINOPHEN 325 MG PO TABS: 650.0000 mg | ORAL_TABLET | ORAL | Status: DC | PRN

## 2020-04-26 MED ORDER — COCONUT OIL OIL: 1.0000 "application " | TOPICAL_OIL | Status: DC | PRN

## 2020-04-26 MED ORDER — BENZOCAINE-MENTHOL 20-0.5 % EX AERO
1.0000 "application " | INHALATION_SPRAY | CUTANEOUS | Status: DC | PRN
  Administered 2020-04-27: 1 via TOPICAL
  Filled 2020-04-26: qty 56

## 2020-04-26 MED ORDER — LACTATED RINGERS IV SOLN: 500.0000 mL | Freq: Once | INTRAVENOUS | Status: DC

## 2020-04-26 MED ORDER — DIPHENHYDRAMINE HCL 50 MG/ML IJ SOLN
12.5000 mg | INTRAMUSCULAR | Status: DC | PRN
Start: 2020-04-26 — End: 2020-04-26

## 2020-04-26 MED ORDER — PHENYLEPHRINE 40 MCG/ML (10ML) SYRINGE FOR IV PUSH (FOR BLOOD PRESSURE SUPPORT)
80.0000 ug | PREFILLED_SYRINGE | INTRAVENOUS | Status: DC | PRN
  Filled 2020-04-26 (×2): qty 10

## 2020-04-26 MED ORDER — TRANEXAMIC ACID-NACL 1000-0.7 MG/100ML-% IV SOLN: 1000.0000 mg | INTRAVENOUS | Status: AC

## 2020-04-26 MED ORDER — FENTANYL-BUPIVACAINE-NACL 0.5-0.125-0.9 MG/250ML-% EP SOLN
EPIDURAL | Status: AC
  Filled 2020-04-26: qty 250

## 2020-04-26 MED ORDER — FENTANYL-BUPIVACAINE-NACL 0.5-0.125-0.9 MG/250ML-% EP SOLN
12.0000 mL/h | EPIDURAL | Status: DC | PRN
  Administered 2020-04-26: 12 mL/h via EPIDURAL

## 2020-04-26 MED ORDER — FENTANYL-BUPIVACAINE-NACL 0.5-0.125-0.9 MG/250ML-% EP SOLN
12.0000 mL/h | EPIDURAL | Status: DC | PRN
  Administered 2020-04-26: 12 mL/h via EPIDURAL
  Filled 2020-04-26: qty 250

## 2020-04-26 MED ORDER — DIBUCAINE (PERIANAL) 1 % EX OINT: 1.0000 "application " | TOPICAL_OINTMENT | CUTANEOUS | Status: DC | PRN

## 2020-04-26 MED ORDER — PRENATAL MULTIVITAMIN CH
1.0000 | ORAL_TABLET | Freq: Every day | ORAL | Status: DC
  Administered 2020-04-27: 1 via ORAL
  Filled 2020-04-26: qty 1

## 2020-04-26 NOTE — Progress Notes (Signed)
Kristy Scott is a 20 y.o. G1P0 at [redacted]w[redacted]d by LMP admitted for induction of labor due to Post dates.   Subjective: Patient comfortable sleeping in bed   Objective: BP 118/72   Pulse 81   Temp 98.8 F (37.1 C) (Oral)   Resp 16   Ht 5\' 5"  (1.651 m)   Wt 111.1 kg   LMP 07/12/2019   SpO2 99%   BMI 40.77 kg/m   FHT:  FHR: 145 bpm, variability: moderate,  accelerations:  Present,  decelerations:  Absent UC:   regular, every 2-3 minutes SVE:   Dilation: 5 Effacement (%): 70 Station: -1 Exam by:: [email or web address], RN  Labs: Lab Results  Component Value Date   WBC 11.5 (H) 04/25/2020   HGB 12.6 04/25/2020   HCT 36.3 04/25/2020   MCV 86.8 04/25/2020   PLT 268 04/25/2020    Assessment / Plan: Induction of labor due to postterm,  progressing well on pitocin  Labor: Was progressing on Pitocin, then stopped due to maternal hypotension (given ephedrine 10 mg inj x1 with BP correction), stopped pit, AROM at 0400 with low threshold to restart pit at 0400  Preeclampsia:  labs stable Fetal Wellbeing:  Category I Pain Control:  Epidural I/D:  GBS pos  Anticipated MOD:  NSVD  04/27/2020 Vencent Hauschild 04/26/2020, 4:01 AM

## 2020-04-26 NOTE — Lactation Note (Signed)
This note was copied from a baby's chart. Lactation Consultation Note  Patient Name: Kristy Scott [id number] Date: 04/26/2020 Reason for consult: L&D Initial assessment;Term;Primapara;1st time breastfeeding Age:20 hours  L&D consult with 29 minutes old infant and P1 mother. Parents are present at time of consult. Congratulated them on their newborn. Infant is latched football position to right breast upon arrival. Discussed STS as ideal transition for infants after birth helping with temperature, blood sugar and comfort. Talked about primal reflexes such as rooting, hands to mouth, searching for the breast among others.   Help to improve latching. Explained LC services availability during postpartum stay. Thanked family for their time.    Maternal Data Has patient been taught Hand Expression?: Yes Does the patient have breastfeeding experience prior to this delivery?: No  Feeding Mother's Current Feeding Choice: Breast Milk  LATCH Score Latch: Grasps breast easily, tongue down, lips flanged, rhythmical sucking.  Audible Swallowing: Spontaneous and intermittent  Type of Nipple: Everted at rest and after stimulation (short nipples)  Comfort (Breast/Nipple): Soft / non-tender  Hold (Positioning): Assistance needed to correctly position infant at breast and maintain latch.  LATCH Score: 9   Lactation Tools Discussed/Used    Interventions Interventions: Breast feeding basics reviewed;Assisted with latch;Skin to skin;Hand express;Expressed milk;Education  Discharge Pump: Personal WIC Program: No  Consult Status Date: 04/26/20 Follow-up type: In-patient    Infant Kristy Scott 04/26/2020, 7:39 PM

## 2020-04-26 NOTE — Progress Notes (Signed)
Kristy Crateris a 20 y.o.G1P0 at [redacted]w[redacted]d by LMPadmitted for induction of labor due toPost dates  Subjective:  Comfortable  Objective: BP 100/84   Pulse 83   Temp 98.8 F (37.1 C) (Oral)   Resp 16   Ht 5\' 5"  (1.651 m)   Wt 111.1 kg   LMP 07/12/2019   SpO2 96%   BMI 40.77 kg/m  No intake/output data recorded. Total I/O In: -  Out: 1925 [Urine:1925]  FHT:  FHR: 120 bpm, variability: moderate no accels, 5 min prolonged late decel  UC:   regular, every 2-3 minutes SVE:   Dilation: Lip/rim Effacement (%): 90 Station: 0 Exam by:: [email or web address] RN  Labs: Lab Results  Component Value Date   WBC 11.5 (H) 04/25/2020   HGB 12.6 04/25/2020   HCT 36.3 04/25/2020   MCV 86.8 04/25/2020   PLT 268 04/25/2020   Assessment / Plan: Induction of labor due to postterm   Labor: s/p cytotec, FB. Pit started at 0200. AROM at 0400. IUPC/FSE in place. Now 9.5cm, 0 station. Will recheck in 1 hour or sooner if needed.    Fetal Wellbeing:  Category II due to decel, reassuring due to mod variability  Pain Control:  Epidural I/D:  GBS pos  Anticipated MOD:  NSVD  04/27/2020 04/26/2020, 3:22 PM

## 2020-04-26 NOTE — Progress Notes (Signed)
Kristy Crateris a 20 y.o.G1P0 at [redacted]w[redacted]d by LMPadmitted for induction of labor due toPost dates  Subjective:   Objective: BP 113/67   Pulse 88   Temp 98.7 F (37.1 C) (Oral)   Resp 16   Ht 5\' 5"  (1.651 m)   Wt 111.1 kg   LMP 07/12/2019   SpO2 99%   BMI 40.77 kg/m  No intake/output data recorded. No intake/output data recorded.  FHT:  FHR: 130 bpm, variability: moderate,  accelerations:  Present,  decelerations:  Absent UC:   regular, every 2-3 minutes SVE:   Dilation: 5 Effacement (%): 50 Station: -1 Exam by:: [email or web address], CNM  Labs: Lab Results  Component Value Date   WBC 11.5 (H) 04/25/2020   HGB 12.6 04/25/2020   HCT 36.3 04/25/2020   MCV 86.8 04/25/2020   PLT 268 04/25/2020   Assessment / Plan: Induction of labor due to postterm,  augmentation with AROM and pitocin  Labor: Labor augmented with AROM, pitocin, minimal cervical change, will increase pitocin     Preeclampsia:  labs stable Fetal Wellbeing:  Category I Pain Control:  Epidural I/D:  GBS pos  Anticipated MOD:  NSVD  04/27/2020 Arney Mayabb 04/26/2020, 6:20 AM

## 2020-04-26 NOTE — Progress Notes (Signed)
Patient ID: Kristy Scott, female   DOB: 08/03/2000, 20 y.o.   MRN: [id number] Kristy Scott is a 20 y.o. G1P0 at [redacted]w[redacted]d admitted for induction of labor due to postdates  Subjective: no complaints  Objective: BP 144/86, HR 83, RR 17, Temp 99.0 FHT:  FHR: 145 bpm, variability: moderate,  accelerations:  Present,  decelerations:  Absent UC:   regular, every 2-3 minutes  SVE:  4/50/-2 @ 2025  Labs: Lab Results  Component Value Date   WBC 11.5 (H) 04/25/2020   HGB 12.6 04/25/2020   HCT 36.3 04/25/2020   MCV 86.8 04/25/2020   PLT 268 04/25/2020    Assessment / Plan: IOL d/t postdates, foley bulb out at 2025, still firm per nurse, will repeat 1 more cytotec then start pitocin per protocol  Labor: s/p foley bulb Fetal Wellbeing:  Category I Pain Control:  Labor support without medications Pre-eclampsia: N/A I/D:  Vancomycin for GBS+ Anticipated MOD: NSVB  Cheral Marker CNM, [facility] 04/25/20 @ 2100

## 2020-04-26 NOTE — Progress Notes (Signed)
Kristy Scott a 20 y.o.G1P0 at [redacted]w[redacted]d by LMPadmitted for induction of labor due toPost dates  Subjective:  Feels contractions on left side sometimes   Objective: BP 120/71   Pulse 99   Temp 98.9 F (37.2 C) (Oral)   Resp 18   Ht 5\' 5"  (1.651 m)   Wt 111.1 kg   LMP 07/12/2019   SpO2 96%   BMI 40.77 kg/m  No intake/output data recorded. Total I/O In: -  Out: 750 [Urine:750]  FHT:  FHR: 130 bpm, variability: moderate no accels, intermittent late and variable decels UC:   regular, every 2-3 minutes SVE:   Dilation: 6.5 Effacement (%): 90 Station: 0 Exam by:: [email or web address] RN  Labs: Lab Results  Component Value Date   WBC 11.5 (H) 04/25/2020   HGB 12.6 04/25/2020   HCT 36.3 04/25/2020   MCV 86.8 04/25/2020   PLT 268 04/25/2020   Assessment / Plan: Induction of labor due to postterm   Labor: s/p cytotec, FB. Pit started at 0200 but have had difficulty titrating pitocin due to Cat II strip, currently at 4. AROM at 0400. IUPC/FSE placed at this time, shows adequate contractions. Head w moderate caput, feels asynclitic. Start amnio. Will monitor closely.    Fetal Wellbeing:  Category II Pain Control:  Epidural I/D:  GBS pos  Anticipated MOD:  NSVD  04/27/2020 04/26/2020, 12:41 PM

## 2020-04-26 NOTE — Anesthesia Preprocedure Evaluation (Addendum)
Anesthesia Evaluation  Patient identified by MRN, date of birth, ID band Patient awake    Reviewed: Allergy & Precautions, Patient's Chart, lab work & pertinent test results  Airway Mallampati: II       Dental no notable dental hx.    Pulmonary    Pulmonary exam normal        Cardiovascular  Rhythm:Regular Rate:Normal     Neuro/Psych    GI/Hepatic   Endo/Other    Renal/GU      Musculoskeletal   Abdominal Normal abdominal exam  (+)   Peds  Hematology   Anesthesia Other Findings   Reproductive/Obstetrics (+) Pregnancy                            Anesthesia Physical Anesthesia Plan  ASA: III  Anesthesia Plan: Epidural   Post-op Pain Management:    Induction:   PONV Risk Score and Plan: 0  Airway Management Planned: Natural Airway and Simple Face Mask  Additional Equipment: None  Intra-op Plan:   Post-operative Plan:   Informed Consent: I have reviewed the patients History and Physical, chart, labs and discussed the procedure including the risks, benefits and alternatives for the proposed anesthesia with the patient or authorized representative who has indicated his/her understanding and acceptance.       Plan Discussed with:   Anesthesia Plan Comments: (Lab Results      Component                Value               Date                      WBC                      11.5 (H)            04/25/2020                HGB                      12.6                04/25/2020                HCT                      36.3                04/25/2020                MCV                      86.8                04/25/2020                PLT                      268                 04/25/2020            Covid-19 Nucleic Acid Test Results Lab Results      Component                Value  Date                      [id number]              NEGATIVE            04/24/2020            )      Anesthesia Quick Evaluation

## 2020-04-26 NOTE — Anesthesia Procedure Notes (Signed)
Epidural Patient location during procedure: OB Start time: 04/26/2020 12:30 AM End time: 04/26/2020 12:36 AM  Staffing Anesthesiologist: Shelton Silvas, MD Performed: anesthesiologist   Preanesthetic Checklist Completed: patient identified, IV checked, site marked, risks and benefits discussed, surgical consent, monitors and equipment checked, pre-op evaluation and timeout performed  Epidural Patient position: sitting Prep: DuraPrep Patient monitoring: heart rate, continuous pulse ox and blood pressure Approach: midline Location: L3-L4 Injection technique: LOR saline  Needle:  Needle type: Tuohy  Needle gauge: 17 G Needle length: 9 cm Catheter type: closed end flexible Catheter size: 20 Guage Test dose: negative and 1.5% lidocaine  Assessment Events: blood not aspirated, injection not painful, no injection resistance and no paresthesia  Additional Notes LOR @ 6.5  Patient identified. Risks/Benefits/Options discussed with patient including but not limited to bleeding, infection, nerve damage, paralysis, failed block, incomplete pain control, headache, blood pressure changes, nausea, vomiting, reactions to medications, itching and postpartum back pain. Confirmed with bedside nurse the patient's most recent platelet count. Confirmed with patient that they are not currently taking any anticoagulation, have any bleeding history or any family history of bleeding disorders. Patient expressed understanding and wished to proceed. All questions were answered. Sterile technique was used throughout the entire procedure. Please see nursing notes for vital signs. Test dose was given through epidural catheter and negative prior to continuing to dose epidural or start infusion. Warning signs of high block given to the patient including shortness of breath, tingling/numbness in hands, complete motor block, or any concerning symptoms with instructions to call for help. Patient was given instructions  on fall risk and not to get out of bed. All questions and concerns addressed with instructions to call with any issues or inadequate analgesia.    Reason for block:procedure for pain

## 2020-04-26 NOTE — Progress Notes (Signed)
Kristy Scott is a 20 y.o. G1P0 at [redacted]w[redacted]d by LMP admitted for induction of labor due to Post dates.    Subjective: After receiving epidural, patient became hypotensive to 99/60. BP stabilized after patient received ephedrine  injection 10 mg x1.    Objective: BP (!) 104/58   Pulse 85   Temp 98.8 F (37.1 C) (Oral)   Resp 17   Ht 5\' 5"  (1.651 m)   Wt 111.1 kg   LMP 07/12/2019   SpO2 99%   BMI 40.77 kg/m  No intake/output data recorded. No intake/output data recorded.  FHT:  FHR: 140 bpm, variability: moderate,  accelerations:  Present,  decelerations:  Absent UC:   regular, every 2-4 minutes SVE:   Dilation: 5 Effacement (%): 70 Station: -1 Exam by:: [email or web address], RN  Labs: Lab Results  Component Value Date   WBC 11.5 (H) 04/25/2020   HGB 12.6 04/25/2020   HCT 36.3 04/25/2020   MCV 86.8 04/25/2020   PLT 268 04/25/2020   Assessment / Plan: Induction of labor due to postterm,  progressing well on pitocin  Labor: Progressing on Pitocin, will continue to increase then AROM Preeclampsia:  labs stable Fetal Wellbeing:  Category I Pain Control:  Epidural I/D:  GBS positive  Anticipated MOD:  NSVD  04/27/2020 Muus 04/26/2020, 2:11 AM

## 2020-04-26 NOTE — Progress Notes (Signed)
    Patient with recurrent deep FHR decelerations lasting about 1 minute noted during every contraction, to nadir of 60s, but goes back up to baseline of 150s with moderate variability and accelerations.    BP (!) 143/89   Pulse (!) 103   Temp 99.7 F (37.6 C) (Oral)   Resp 18   Ht 5\' 5"  (1.651 m)   Wt 111.1 kg   LMP 07/12/2019   SpO2 99%   BMI 40.77 kg/m  No intake/output data recorded. Total I/O In: -  Out: 2675 [Urine:2675] SVE:   Dilation: 10 Effacement (%): 100 Station: -1 Exam by:: Dr. [email or web address]  Assessment / Plan: Induction of labor due to postterm. Prolonged active phase now in second stage. Category 2 FHR tracing. Will start pushing. Patient counseled about possible need for vacuum assistance. Risks of vacuum assistance were discussed in detail, including but not limited to, bleeding, infection, damage to maternal tissues, fetal cephalohematoma, inability to effect vaginal delivery of the head or shoulder dystocia that cannot be resolved by established maneuvers and need for emergency cesarean section.  Patient gave verbal consent. Will push for now, but ready to assist with vacuum as needed.   Myriam Jacobson, MD, FACOG Obstetrician & Gynecologist, [facility]

## 2020-04-26 NOTE — Discharge Summary (Addendum)
Postpartum Discharge Summary     Patient Name: Kristy Scott DOB: Jan 15, 2001 MRN: [id number]  Date of admission: 04/25/2020 Delivery date:04/26/2020  Delivering provider: Janet Berlin  Date of discharge: 04/28/2020  Admitting diagnosis: Indication for care in labor or delivery [O75.9] Intrauterine pregnancy: [redacted]w[redacted]d    Secondary diagnosis:  Active Problems:   Group B Streptococcus carrier, +RV culture, currently pregnant   Indication for care in labor or delivery   Vaginal delivery  Additional problems: NA    Discharge diagnosis: Term Pregnancy Delivered                                              Post partum procedures:rhogam Augmentation: AROM, Pitocin, Cytotec and IP Foley Complications: 20 second shoulder dystocia   Hospital course: Induction of Labor With Vaginal Delivery   20y.o. yo G1P1001 at 20w2das admitted to the hospital 04/25/2020 for induction of labor.  Indication for induction: Postdates.  Patient had an uncomplicated labor course as follows: Membrane Rupture Time/Date: 3:59 AM ,04/26/2020   Delivery Method:Vaginal, Spontaneous  Episiotomy: None  Lacerations:  Labial  Details of delivery can be found in separate delivery note.  Patient had a routine postpartum course. Patient is discharged home 04/28/20.  Newborn Data: Birth date:04/26/2020  Birth time:6:40 PM  Gender:Female  Living status:Living  Apgars:8 ,8  Weight:3289 g   Magnesium Sulfate received: No BMZ received: No Rhophylac: yes MMR:N/A T-DaP:Given prenatally Flu: Yes Transfusion:Yes  Physical exam  Vitals:   04/27/20 0854 04/27/20 1310 04/27/20 2111 04/28/20 0610  BP: 107/67 128/74 (!) 100/58 (!) 108/55  Pulse: 90 100 72 75  Resp: 18 20 16 18   Temp: 97.7 F (36.5 C) 98.7 F (37.1 C) 97.9 F (36.6 C) 98.7 F (37.1 C)  TempSrc: Oral Oral Oral Oral  SpO2:  100% 100% 100%  Weight:      Height:       General: alert, cooperative and no distress Lochia: appropriate Uterine  Fundus: firm Incision: N/A DVT Evaluation: No evidence of DVT seen on physical exam. Labs: Lab Results  Component Value Date   WBC 11.5 (H) 04/25/2020   HGB 12.6 04/25/2020   HCT 36.3 04/25/2020   MCV 86.8 04/25/2020   PLT 268 04/25/2020   CMP Latest Ref Rng & Units 12/11/2018  Glucose 70 - 99 mg/dL 102(H)  BUN 6 - 20 mg/dL 5(L)  Creatinine 0.44 - 1.00 mg/dL 0.92  Sodium 135 - 145 mmol/L 135  Potassium 3.5 - 5.1 mmol/L 3.9  Chloride 98 - 111 mmol/L 100  CO2 22 - 32 mmol/L 19(L)  Calcium 8.9 - 10.3 mg/dL 9.1  Total Protein 6.5 - 8.1 g/dL 6.9  Total Bilirubin 0.3 - 1.2 mg/dL 1.0  Alkaline Phos 38 - 126 U/L 64  AST 15 - 41 U/L 11(L)  ALT 0 - 44 U/L 16   Edinburgh Score: Edinburgh Postnatal Depression Scale Screening Tool 04/27/2020  I have been able to laugh and see the funny side of things. 0  I have looked forward with enjoyment to things. 0  I have blamed myself unnecessarily when things went wrong. 1  I have been anxious or worried for no good reason. 1  I have felt scared or panicky for no good reason. 0  Things have been getting on top of me. 0  I have been so unhappy  that I have had difficulty sleeping. 0  I have felt sad or miserable. 0  I have been so unhappy that I have been crying. 0  The thought of harming myself has occurred to me. 0  Edinburgh Postnatal Depression Scale Total 2     After visit meds:  Allergies as of 04/28/2020       Reactions   Penicillins Hives   Amoxicillin Hives   No history of taking cephalosporins        Medication List     TAKE these medications    Blood Pressure Monitor Automat Devi 1 Device by Does not apply route daily. Automatic blood pressure cuff regular size. To monitor blood pressure regularly at home. ICD-10 code:Z34.90   calcium carbonate 500 MG chewable tablet Commonly known as: TUMS - dosed in mg elemental calcium Chew 1-2 tablets by mouth 4 (four) times daily as needed for indigestion or heartburn.    prenatal multivitamin Tabs tablet Take 1 tablet by mouth daily at 12 noon.         Discharge home in stable condition Infant Feeding: Breast Infant Disposition:home with mother Discharge instruction: per After Visit Summary and Postpartum booklet. Activity: Advance as tolerated. Pelvic rest for 6 weeks.  Diet: routine diet Future Appointments: Future Appointments  Date Time Provider [place]  06/07/2020  9:30 AM Rasch, Artist Pais, NP [facility] None   Follow up Visit:    Please schedule this patient for a In person postpartum visit in 6 weeks with the following provider: Any provider. Additional Postpartum F/U: IUD insertion at six weeks   Low risk pregnancy complicated by:  uncomplicated Delivery mode:  Vaginal, Spontaneous  Anticipated Birth Control:  IUD   04/28/2020 Gifford Shave, MD  .Wyn Quaker of Attending Supervision of Advanced Practice Provider (PA/CNM/NP): Evaluation and management procedures were performed by the Advanced Practice Provider under my supervision and collaboration.  I have reviewed the Advanced Practice Provider's note and chart, and I agree with the management and plan. I have also made any necessary editorial changes.   Annalee Genta, DO Attending [place], [facility] for [facility] [place] [facility] 05/01/2020 1:21 PM

## 2020-04-27 MED ORDER — RHO D IMMUNE GLOBULIN 1500 UNIT/2ML IJ SOSY
300.0000 ug | PREFILLED_SYRINGE | Freq: Once | INTRAMUSCULAR | Status: AC
  Administered 2020-04-27: 300 ug via INTRAVENOUS
  Filled 2020-04-27: qty 2

## 2020-04-27 NOTE — Lactation Note (Signed)
This note was copied from a baby's chart. Lactation Consultation Note  Patient Name: Kristy Scott [id number] Date: 04/27/2020 Reason for consult: Follow-up assessment;Mother's request;Difficult latch;Primapara;1st time breastfeeding;Term Age:20 hours   Mom states infant not able to sustain the latch. Mom pre pumped colostrum prior to delivery, fed 7 ml of EBM via curve tip and finger with help of RN. LC not able to see a latch at this time since infant just fed.   Mom pumped 2x today but did not get any colostrum. LC encouraged Mom to use DEBP, assessed her flange size an increased her from 24 to 27. Mom also use EBM for nipple care.   Mom to call for assistance when infant cues for next feeding for RN or LC.   Maternal Data    Feeding Mother's Current Feeding Choice: Breast Milk  LATCH Score                    Lactation Tools Discussed/Used Tools: Pump;Flanges Flange Size: 27 Breast pump type: Double-Electric Breast Pump Pump Education: Setup, frequency, and cleaning;Milk Storage Reason for Pumping: increase stimulation Pumping frequency: every 3 hrs for 15 minutes  Interventions Interventions: Breast feeding basics reviewed;Education;DEBP  Discharge Pump: DEBP  Consult Status Consult Status: Follow-up Date: 04/28/20 Follow-up type: In-patient    Domnic Vantol  [name] 04/27/2020, 4:57 PM

## 2020-04-27 NOTE — Clinical Social Work Maternal (Signed)
CLINICAL SOCIAL WORK MATERNAL/CHILD NOTE  Patient Details  Name: Amrutha Avera MRN: [id number] Date of Birth: [birth year]  Date:  04/27/2020  Clinical Social Worker Initiating Note:  Darra Lis, MSW, [place] Date/Time: Initiated:  04/27/20/0930     Child's Name:  Melven Sartorius   Biological Parents:  Mother,Father (Carlos [place])   Need for Interpreter:  None   Reason for Referral:  Current Substance Use/Substance Use During Pregnancy    Address:  [place] [address]    Phone number:  [phone] (home)     Additional phone number:   Household Members/Support Persons (HM/SP):   Household Member/Support Person 1   HM/SP Name Relationship DOB or Age  HM/SP -[place] Significant Other 06/24/1998  HM/SP -2        HM/SP -3        HM/SP -4        HM/SP -5        HM/SP -6        HM/SP -7        HM/SP -8          Natural Supports (not living in the home):  Spouse/significant other   Professional Supports: None   Employment: Full-time   Type of Work: Surveyor, minerals   Education:  [place] arranged:    Museum/gallery curator Resources:  Medicaid   Other Resources:      Cultural/Religious Considerations Which May Impact Care:    Strengths:  Ability to meet basic needs ,Pediatrician chosen,Home prepared for child    Psychotropic Medications:         Pediatrician:    [place]olic[place] area  Pediatrician List:   [place]      Pediatrician Fax Number:    Risk Factors/Current Problems:  Substance Use    Cognitive State:  Alert ,Insightful ,Linear Thinking    Mood/Affect:  Happy ,Bright ,Interested    CSW Assessment: CSW consulted for THC use. CSW met with MOB to complete assessment and offer resources. CSW introduced self and role. CSW observed infant sleeping in bassinet. MOB was engaged, appropriate and  pleasant. FOB entered the room in the middle of the assessment and MOB stated he could remain. CSW informed MOB of the reason for consult. MOB reported she did not use THC during pregnancy. MOB stated she has not used THC since she found out she was four weeks pregnant, at the beginning of July. CSW informed MOB of the hospital drug screen policy. MOB was made aware of the UDS and CDS. CSW informed MOB that a CPS report will be made if infant tests positive for substances. MOB was understanding and denies any questions.   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 MOB expressed interest in [facility] and was provided information to establish an appointment. MOB disclosed she has a history of anxiety and was diagnosed about three years ago. MOB stated she was on Zoloft two years ago to treat. MOB shared she is currently feeling well and denies experiencing any anxiety in pregnancy. MOB identified her significant other as a supports and denies any current SI or HI.   CSW provided education regarding the baby blues period versus PPD and provided resources. CSW provided the New Mom Checklist and encouraged MOB to self evaluate and contact a medical professional if symptoms  are noted at any time.  CSW provided review of Sudden Infant Death Syndrome (SIDS) precautions. MOB reported she has all necessities for infant, including a bassinet. MOB denies any barriers to follow-up care. MOB reported she has no additional needs at this time.   CSW will continue to follow CDS/UDS and make a CPS report if warranted. CSW identifies no further need for intervention and no barriers to discharge at this time.  CSW Plan/Description:  Child Copy Report ,CSW Will Continue to Monitor Umbilical Cord Tissue Drug Screen Results and Make Report if [facility] Drug Screen Policy Information,Sudden Infant Death Syndrome (SIDS) Education,Perinatal Mood and Anxiety Disorder (PMADs) Education,No Further  Intervention Required/No Barriers to Discharge,Other Information/Referral to [employer], [facility] 04/27/2020, 11:25 AM

## 2020-04-27 NOTE — Progress Notes (Addendum)
POSTPARTUM PROGRESS NOTE  Subjective: Kristy Scott is a 20 y.o. G1P1001 s/p SVD at [redacted]w[redacted]d.  She reports she's doing well. No acute events overnight. She denies any problems with ambulating, voiding or po intake. Denies nausea or vomiting. She has passed flatus. Pain is well controlled.  Lochia is appropriate.  Objective: Blood pressure 119/69, pulse 84, temperature 97.7 F (36.5 C), temperature source Oral, resp. rate 16, height 5\' 5"  (1.651 m), weight 111.1 kg, last menstrual period 07/12/2019, SpO2 99 %, unknown if currently breastfeeding.  Physical Exam:  General: alert, cooperative and no distress Chest: no respiratory distress Abdomen: soft, non-tender  Uterine Fundus: firm and at level of umbilicus Extremities: No calf swelling or tenderness  Recent Labs    04/25/20 1629  HGB 12.6  HCT 36.3    Assessment/Plan: Kristy Scott is a 20 y.o. G1P1001 on PPD#1 s/p SVD with 20 second SD at [redacted]w[redacted]d.  Routine Postpartum Care: Doing well, pain well-controlled.  -- Continue routine care -- Contraception: outpatient [place] -- Feeding: Breast, lactation support prn Rh negative: Rhogam eval  Dispo: Plan for discharge tomorrow.   [redacted]w[redacted]d, MD PGY-1 Family Medicine 04/27/2020 6:44 AM   GME ATTESTATION:  I saw and evaluated the patient. I agree with the findings and the plan of care as documented in the resident's note.  04/29/2020, MD OB Fellow, Faculty [facility], Center for [facility] 04/27/2020 7:48 AM

## 2020-04-27 NOTE — Anesthesia Postprocedure Evaluation (Signed)
Anesthesia Post Note  Patient: Kristy Scott  Procedure(s) Performed: AN AD HOC LABOR EPIDURAL     Patient location during evaluation: Mother Baby Anesthesia Type: Epidural Level of consciousness: awake Pain management: satisfactory to patient Vital Signs Assessment: post-procedure vital signs reviewed and stable Respiratory status: spontaneous breathing Cardiovascular status: stable Anesthetic complications: no   No complications documented.  Last Vitals:  Vitals:   04/27/20 0854 04/27/20 1310  BP: 107/67 128/74  Pulse: 90 100  Resp: 18 20  Temp: 36.5 C 37.1 C  SpO2:  100%    Last Pain:  Vitals:   04/27/20 1310  TempSrc: Oral  PainSc:    Pain Goal:                   [employer]

## 2020-04-27 NOTE — Lactation Note (Signed)
This note was copied from a baby's chart. Lactation Consultation Note  Patient Name: Kristy Scott [id number] Date: 04/27/2020 Reason for consult: Initial assessment;Mother's request;Difficult latch;Primapara;1st time breastfeeding;Term;Other (Comment) (THC use) Age:20 hours  Infant sleeping on Mom swaddled. Mom stated not able to get infant to latch offered drops of colostrum.   LC placed infant STS offering 1 ml of colostrum via spoon and finger feeding. Infant then able to latch in football on the left side for 15 minutes.   Mom flat nipples that will erect with stimulation, nipples are widely spaced and breasts are tubular.   Infant lingual attachment but tongue is not heart shaped. With suck training, able to feel tongue pass the gum line.   Plan 1. To feed based on cues 8-12x in 24 hrs period no more than 4 hrs without an attempt.           2. Mom to pre pump 5-10 minutes using manual pump before latching.               3. I and O sheet reviewed.                4. LC brochure reviewed.   Maternal Data Has patient been taught Hand Expression?: Yes Does the patient have breastfeeding experience prior to this delivery?: No  Feeding Mother's Current Feeding Choice: Breast Milk  LATCH Score Latch: Repeated attempts needed to sustain latch, nipple held in mouth throughout feeding, stimulation needed to elicit sucking reflex.  Audible Swallowing: Spontaneous and intermittent  Type of Nipple: Flat  Comfort (Breast/Nipple): Soft / non-tender  Hold (Positioning): Assistance needed to correctly position infant at breast and maintain latch.  LATCH Score: 7   Lactation Tools Discussed/Used Tools: Pump;Flanges Flange Size: 24 Breast pump type: Manual Pump Education: Setup, frequency, and cleaning;Milk Storage Reason for Pumping: increase stimulation Pumping frequency: pre pump 5-10 minutes before latching  Interventions Interventions: Breast feeding basics  reviewed;Support pillows;Education;Assisted with latch;Position options;Skin to skin;Expressed milk;Breast massage;Hand express;Pre-pump if needed;Hand pump;Breast compression;Adjust position  Discharge Pump: Personal  Consult Status Consult Status: Follow-up Date: 04/27/20 Follow-up type: In-patient    Kristy Scott  Kristy Scott 04/27/2020, 12:27 AM

## 2020-04-28 LAB — RH IG WORKUP (INCLUDES ABO/RH)
ABO/RH(D): A NEG
Fetal Screen: NEGATIVE
Gestational Age(Wks): 41.2
Unit division: 0

## 2020-04-28 NOTE — Discharge Instructions (Signed)

## 2020-04-28 NOTE — Lactation Note (Signed)
This note was copied from a baby's chart. Lactation Consultation Note  Patient Name: Kristy Fortune Torosian [id number] Date: 04/28/2020 Reason for consult: Follow-up assessment Age:20 hours   P1 mother whose infant is now 40 hours old.  This is a term baby at 41+2 weeks.    Baby was swaddled and awake in bassinet when I arrived.  Mother breast fed approximately one hour ago.  Mother informed me that she is no longer having trouble with latching and that baby has latched and fed well during the night; no LATCH score noted in 2 days.  RN to observe a LATCH score today if possible.  Mother has been pumping at feeding back any EBM she obtains to baby.  She is familiar with how to use her manual pump in her room also.  Engorgement prevention/treatment reviewed.  Mother has our OP phone number for any questions/concerns after discharge.  Father present.  RN updated.   Maternal Data    Feeding    LATCH Score                    Lactation Tools Discussed/Used    Interventions    Discharge Discharge Education: Engorgement and breast care  Consult Status Consult Status: Complete Date: 04/28/20 Follow-up type: In-patient    Lamiah Marmol R Erland Vivas 04/28/2020, 8:02 AM

## 2020-06-07 ENCOUNTER — Ambulatory Visit (INDEPENDENT_AMBULATORY_CARE_PROVIDER_SITE_OTHER): Payer: BC Managed Care – PPO | Admitting: Obstetrics and Gynecology

## 2020-06-07 ENCOUNTER — Other Ambulatory Visit: Payer: Self-pay

## 2020-06-07 DIAGNOSIS — Z3009 Encounter for other general counseling and advice on contraception: Secondary | ICD-10-CM

## 2020-06-07 MED ORDER — NORETHINDRONE 0.35 MG PO TABS: 1.0000 | ORAL_TABLET | Freq: Every day | ORAL | 11 refills | Status: DC

## 2020-06-07 NOTE — Progress Notes (Signed)
Post Partum Visit Note  Kristy Scott is a 20 y.o. G51P1001 female who presents for a postpartum visit. She is 6 weeks postpartum following a normal spontaneous vaginal delivery.  I have fully reviewed the prenatal and intrapartum course. The delivery was at 41.2 gestational weeks.  Anesthesia: epidural. Postpartum course has been uncomplicated. Baby is doing well. Baby is feeding by breast. Bleeding no bleeding. Bowel function is normal. Bladder function is normal. Patient is not sexually active. Contraception method is none. Postpartum depression screening: negative.  The pregnancy intention screening data noted above was reviewed. Potential methods of contraception were discussed. The patient elected to proceed with Oral Contraceptive.    Edinburgh Postnatal Depression Scale - 06/07/20 0933      Edinburgh Postnatal Depression Scale:  In the Past 7 Days   I have been able to laugh and see the funny side of things. 0    I have looked forward with enjoyment to things. 0    I have blamed myself unnecessarily when things went wrong. 0    I have been anxious or worried for no good reason. 0    I have felt scared or panicky for no good reason. 0    Things have been getting on top of me. 0    I have been so unhappy that I have had difficulty sleeping. 0    I have felt sad or miserable. 0    I have been so unhappy that I have been crying. 0    The thought of harming myself has occurred to me. 0    Edinburgh Postnatal Depression Scale Total 0          Health Maintenance Due  Topic Date Due  . COVID-19 Vaccine (1) Never done  . HPV VACCINES (1 - 2-dose series) Never done    The following portions of the patient's history were reviewed and updated as appropriate: allergies, current medications, past family history, past medical history, past social history, past surgical history and problem list.  Review of Systems Pertinent items are noted in HPI.  Objective:  BP 98/71 (BP  Location: Left Arm, Patient Position: Sitting, Cuff Size: Large)   Pulse 77   Temp (!) 97.5 F (36.4 C) (Oral)   Ht 5\' 5"  (1.651 m)   Wt 228 lb (103.4 kg)   LMP 07/12/2019   Breastfeeding Yes   BMI 37.94 kg/m    General:  alert, cooperative and appears stated age  Lungs: clear to auscultation bilaterally  Heart:  regular rate and rhythm, S1, S2 normal, no murmur, click, rub or gallop  Abdomen: soft, non-tender; bowel sounds normal; no masses,  no organomegaly        Assessment:   1. Postpartum exam   2. Birth control counseling  Desires pills at this time. She is considering LARC, however would like to call and make an appointment later    Plan:   Essential components of care per ACOG recommendations:  1.  Mood and well being: Patient with negative depression screening today. Reviewed local resources for support.  - Patient tobacco use? No.   - hx of drug use? No.    2. Infant care and feeding:  -Patient currently breastmilk feeding? Yes. Reviewed importance of draining breast regularly to support lactation.  -Social determinants of health (SDOH) reviewed in EPIC.   3. Sexuality, contraception and birth spacing - Patient does not want a pregnancy in the next year. Patient desired oral progesterone-only contraceptive today.   -  Discussed birth spacing of 18 months  4. Sleep and fatigue -Encouraged family/partner/community support of 4 hrs of uninterrupted sleep to help with mood and fatigue  5. Physical Recovery  - Discussed patients delivery and complications. She describes her labor as good. - Patient had a Vaginal, no problems at delivery. Patient had a 1st degree laceration. Perineal healing reviewed. Patient expressed understanding - Patient has urinary incontinence? No. - Patient is safe to resume physical and sexual activity- discussed using lubrication for comfort.   6.  Health Maintenance - HM due items addressed Yes - Last pap smear No results found for:  DIAGPAP Pap smear not done at today's visit. NA age.  -Breast Cancer screening indicated? No.   7. Chronic Disease/Pregnancy Condition follow up: None  - PCP follow up  Venia Carbon, NP Center for [employer], [facility]

## 2020-08-10 ENCOUNTER — Other Ambulatory Visit: Payer: Self-pay

## 2020-08-10 ENCOUNTER — Encounter (HOSPITAL_COMMUNITY): Payer: Self-pay

## 2020-08-10 ENCOUNTER — Ambulatory Visit (HOSPITAL_COMMUNITY)
Admission: RE | Admit: 2020-08-10 | Discharge: 2020-08-10 | Disposition: A | Discharge status: Home / Self Care | Payer: BC Managed Care – PPO | Source: Ambulatory Visit | Attending: Family Medicine | Admitting: Family Medicine

## 2020-08-10 VITALS — BP 128/66 | HR 98 | Temp 99.6°F | Resp 17

## 2020-08-10 DIAGNOSIS — L0291 Cutaneous abscess, unspecified: Secondary | ICD-10-CM | POA: Diagnosis not present

## 2020-08-10 DIAGNOSIS — L989 Disorder of the skin and subcutaneous tissue, unspecified: Secondary | ICD-10-CM | POA: Diagnosis not present

## 2020-08-10 MED ORDER — SULFAMETHOXAZOLE-TRIMETHOPRIM 800-160 MG PO TABS: 1.0000 | ORAL_TABLET | Freq: Two times a day (BID) | ORAL | 0 refills | Status: AC

## 2020-08-10 MED ORDER — HIBICLENS 4 % EX LIQD: Freq: Every day | CUTANEOUS | 0 refills | Status: DC | PRN

## 2020-08-10 NOTE — ED Provider Notes (Signed)
[facility]    CSN: [id number] Arrival date & time: 08/10/20  1651      History   Chief Complaint Chief Complaint  Patient presents with   Appointment    5pm    HPI Kristy Scott is a 20 y.o. female.   Patient presenting today with 1 week history of a painful bump in the left groin area.  She states that it continued to get larger and more painful until yesterday when the area burst did and drained pus and blood.  The bump was on top of a stretch mark in this area and she states that the area just continues to get larger.  Still has some tenderness to the area but no active drainage, fever, chills.  Washing the area with soap and water and applying Neosporin and keeping covered.  Denies any injury to the area.  Is 4 months postpartum and breast-feeding.   Past Medical History:  Diagnosis Date   Medical history non-contributory     Patient Active Problem List   Diagnosis Date Noted   Postpartum exam 06/07/2020   Birth control counseling 06/07/2020   Vaginal delivery 04/26/2020   Indication for care in labor or delivery 04/25/2020   Group B Streptococcus carrier, +RV culture, currently pregnant 03/30/2020   Supervision of normal first pregnancy, antepartum 11/01/2019    Past Surgical History:  Procedure Laterality Date   NO PAST SURGERIES      OB History     Gravida  1   Para  1   Term  1   Preterm      AB      Living  1      SAB      IAB      Ectopic      Multiple  0   Live Births  1            Home Medications    Prior to Admission medications   Medication Sig Start Date End Date Taking? Authorizing Provider  chlorhexidine (HIBICLENS) 4 % external liquid Apply topically daily as needed. 08/10/20  Yes Particia Nearing, PA-C  sulfamethoxazole-trimethoprim (BACTRIM DS) 800-160 MG tablet Take 1 tablet by mouth 2 (two) times daily for 7 days. 08/10/20 08/17/20 Yes Particia Nearing, PA-C  Blood Pressure Monitoring (BLOOD  PRESSURE MONITOR AUTOMAT) DEVI 1 Device by Does not apply route daily. Automatic blood pressure cuff regular size. To monitor blood pressure regularly at home. ICD-10 code:Z34.90 11/01/19   [name], CNM  calcium carbonate (TUMS - DOSED IN MG ELEMENTAL CALCIUM) 500 MG chewable tablet Chew 1-2 tablets by mouth 4 (four) times daily as needed for indigestion or heartburn.    [provider]  norethindrone (MICRONOR) 0.35 MG tablet Take 1 tablet (0.35 mg total) by mouth daily. 06/07/20   Rasch, Harolyn Rutherford, NP  Prenatal Vit-Fe Fumarate-FA (PRENATAL MULTIVITAMIN) TABS tablet Take 1 tablet by mouth daily at 12 noon.    [provider]    Family History History reviewed. No pertinent family history.  Social History Social History   Tobacco Use   Smoking status: Never   Smokeless tobacco: Never  Vaping Use   Vaping Use: Never used  Substance Use Topics   Alcohol use: Never   Drug use: Not Currently    Types: Marijuana    Comment: stopped 3 months ago     Allergies   Penicillins and Amoxicillin   Review of Systems Review of Systems Per HPI  Physical Exam Triage Vital Signs ED Triage Vitals  Enc Vitals Group     BP 08/10/20 1714 128/66     Pulse Rate 08/10/20 1712 98     Resp 08/10/20 1712 17     Temp 08/10/20 1712 99.6 F (37.6 C)     Temp Source 08/10/20 1712 Oral     SpO2 08/10/20 1712 100 %     Weight --      Height --      Head Circumference --      Peak Flow --      Pain Score 08/10/20 1711 0     Pain Loc --      Pain Edu? --      Excl. in GC? --    No data found.  Updated Vital Signs BP 128/66 (BP Location: Right Arm)   Pulse 98   Temp 99.6 F (37.6 C) (Oral)   Resp 17   LMP 08/10/2020 (Exact Date)   SpO2 100%   Breastfeeding Unknown Comment: pumping  Visual Acuity Right Eye Distance:   Left Eye Distance:   Bilateral Distance:    Right Eye Near:   Left Eye Near:    Bilateral Near:     Physical Exam Vitals and nursing note  reviewed.  Constitutional:      Appearance: Normal appearance. She is not ill-appearing.  HENT:     Head: Atraumatic.  Eyes:     Extraocular Movements: Extraocular movements intact.     Conjunctiva/sclera: Conjunctivae normal.  Cardiovascular:     Rate and Rhythm: Normal rate and regular rhythm.     Heart sounds: Normal heart sounds.  Pulmonary:     Effort: Pulmonary effort is normal.     Breath sounds: Normal breath sounds.  Abdominal:     General: Bowel sounds are normal. There is no distension.     Palpations: Abdomen is soft.     Tenderness: There is no abdominal tenderness. There is no guarding.  Musculoskeletal:        General: Normal range of motion.     Cervical back: Normal range of motion and neck supple.  Skin:    General: Skin is warm.     Comments: Open skin lesion to the left groin fold involving a striae.  No surrounding erythema, edema, active drainage or bleeding.  Mildly tender to palpation  Neurological:     Mental Status: She is alert and oriented to person, place, and time.  Psychiatric:        Mood and Affect: Mood normal.        Thought Content: Thought content normal.        Judgment: Judgment normal.   UC Treatments / Results  Labs (all labs ordered are listed, but only abnormal results are displayed) Labs Reviewed - No data to display  EKG  Radiology No results found.  Procedures Procedures (including critical care time)  Medications Ordered in UC Medications - No data to display  Initial Impression / Assessment and Plan / UC Course  I have reviewed the triage vital signs and the nursing notes.  Pertinent labs & imaging results that were available during my care of the patient were reviewed by me and considered in my medical decision making (see chart for details).     Per patient the area has already drained, will cover with broad-spectrum antibiotics, Hibiclens and discussed home wound care.  Monitor closely for improvement, return  right away for worsening course but recheck in  1 week either way.  Lactmed reviewed for safety regarding her Bactrim, discussed this with patient.  Final Clinical Impressions(s) / UC Diagnoses   Final diagnoses:  Skin lesion  Abscess   Discharge Instructions   None    ED Prescriptions     Medication Sig Dispense Auth. Provider   sulfamethoxazole-trimethoprim (BACTRIM DS) 800-160 MG tablet Take 1 tablet by mouth 2 (two) times daily for 7 days. 14 tablet Particia Nearing, [place]   chlorhexidine (HIBICLENS) 4 % external liquid Apply topically daily as needed. 120 mL Particia Nearing, [place]      PDMP not reviewed this encounter.   Particia Nearing, [place] 08/10/20 [phone]

## 2020-08-10 NOTE — ED Triage Notes (Signed)
Pt presents with a bump on her thigh X 1 week.

## 2022-01-31 ENCOUNTER — Encounter (HOSPITAL_COMMUNITY): Payer: Self-pay | Admitting: Emergency Medicine

## 2022-01-31 ENCOUNTER — Ambulatory Visit (HOSPITAL_COMMUNITY)
Admission: EM | Admit: 2022-01-31 | Discharge: 2022-01-31 | Disposition: A | Discharge status: Home / Self Care | Payer: BC Managed Care – PPO | Attending: Internal Medicine | Admitting: Internal Medicine

## 2022-01-31 ENCOUNTER — Other Ambulatory Visit: Payer: Self-pay

## 2022-01-31 DIAGNOSIS — N3001 Acute cystitis with hematuria: Secondary | ICD-10-CM | POA: Diagnosis present

## 2022-01-31 DIAGNOSIS — N760 Acute vaginitis: Secondary | ICD-10-CM | POA: Diagnosis present

## 2022-01-31 LAB — POCT URINALYSIS DIPSTICK, ED / UC
Bilirubin Urine: NEGATIVE
Glucose, UA: NEGATIVE mg/dL
Ketones, ur: NEGATIVE mg/dL
Nitrite: NEGATIVE
Protein, ur: NEGATIVE mg/dL
Specific Gravity, Urine: 1.01 (ref 1.005–1.030)
Urobilinogen, UA: 0.2 mg/dL (ref 0.0–1.0)
pH: 6.5 (ref 5.0–8.0)

## 2022-01-31 LAB — POC URINE PREG, ED: Preg Test, Ur: NEGATIVE

## 2022-01-31 MED ORDER — FLUCONAZOLE 150 MG PO TABS: 150.0000 mg | ORAL_TABLET | Freq: Once | ORAL | 0 refills | Status: AC

## 2022-01-31 MED ORDER — SULFAMETHOXAZOLE-TRIMETHOPRIM 800-160 MG PO TABS: 1.0000 | ORAL_TABLET | Freq: Two times a day (BID) | ORAL | 0 refills | Status: AC

## 2022-01-31 NOTE — ED Notes (Signed)
Cytology with label -placed in minilab

## 2022-01-31 NOTE — ED Provider Notes (Signed)
[facility]    CSN: [id number] Arrival date & time: 01/31/22  0805      History   Chief Complaint Chief Complaint  Patient presents with   Urinary Tract Infection    HPI Kristy Scott is a 21 y.o. female comes to the urgent care with 5-day history of burning urination, increased frequency of urination and urgency of urination.  Patient has lower abdominal pain with no nausea or vomiting.  No fever or chills.  No dizziness, near syncope or syncopal episodes.  Patient complains of some vaginal itching and vaginal redness.  No vaginal discharge.  No vaginal odor.  Patient is in a monogamous relationship.  No concernS for STI.Marland Kitchen   HPI  Past Medical History:  Diagnosis Date   Medical history non-contributory     Patient Active Problem List   Diagnosis Date Noted   Postpartum exam 06/07/2020   Birth control counseling 06/07/2020   Vaginal delivery 04/26/2020   Indication for care in labor or delivery 04/25/2020   Group B Streptococcus carrier, +RV culture, currently pregnant 03/30/2020   Supervision of normal first pregnancy, antepartum 11/01/2019    Past Surgical History:  Procedure Laterality Date   NO PAST SURGERIES      OB History     Gravida  1   Para  1   Term  1   Preterm      AB      Living  1      SAB      IAB      Ectopic      Multiple  0   Live Births  1            Home Medications    Prior to Admission medications   Medication Sig Start Date End Date Taking? Authorizing Provider  fluconazole (DIFLUCAN) 150 MG tablet Take 1 tablet (150 mg total) by mouth once for 1 dose. These take second dose on day 3 if symptoms are not improving. 01/31/22 01/31/22 Yes Rolland Steinert, Britta Mccreedy, MD  sulfamethoxazole-trimethoprim (BACTRIM DS) 800-160 MG tablet Take 1 tablet by mouth 2 (two) times daily for 3 days. 01/31/22 02/03/22 Yes Stevan Eberwein, Britta Mccreedy, MD  VYVANSE 10 MG capsule Take 10 mg by mouth every morning. 12/15/21  Yes [provider]  Blood Pressure Monitoring (BLOOD PRESSURE MONITOR AUTOMAT) DEVI 1 Device by Does not apply route daily. Automatic blood pressure cuff regular size. To monitor blood pressure regularly at home. ICD-10 code:Z34.90 11/01/19   [name], CNM  calcium carbonate (TUMS - DOSED IN MG ELEMENTAL CALCIUM) 500 MG chewable tablet Chew 1-2 tablets by mouth 4 (four) times daily as needed for indigestion or heartburn.    [provider]  chlorhexidine (HIBICLENS) 4 % external liquid Apply topically daily as needed. 08/10/20   Particia Nearing, PA-C  norethindrone (MICRONOR) 0.35 MG tablet Take 1 tablet (0.35 mg total) by mouth daily. Patient not taking: Reported on 01/31/2022 06/07/20   Rasch, Victorino Dike I, NP  Prenatal Vit-Fe Fumarate-FA (PRENATAL MULTIVITAMIN) TABS tablet Take 1 tablet by mouth daily at 12 noon.    [provider]    Family History History reviewed. No pertinent family history.  Social History Social History   Tobacco Use   Smoking status: Never   Smokeless tobacco: Never  Vaping Use   Vaping Use: Never used  Substance Use Topics   Alcohol use: Never   Drug use: Yes    Types: Marijuana    Comment:  stopped 3 months ago     Allergies   Penicillins and Amoxicillin   Review of Systems Review of Systems As per HPI.  Physical Exam Triage Vital Signs ED Triage Vitals  Enc Vitals Group     BP 01/31/22 0837 128/85     Pulse Rate 01/31/22 0837 (!) 104     Resp 01/31/22 0837 20     Temp 01/31/22 0837 98.3 F (36.8 C)     Temp Source 01/31/22 0837 Oral     SpO2 01/31/22 0837 98 %     Weight --      Height --      Head Circumference --      Peak Flow --      Pain Score 01/31/22 0834 7     Pain Loc --      Pain Edu? --      Excl. in GC? --    No data found.  Updated Vital Signs BP 128/85 (BP Location: Right Arm) Comment (BP Location): large cuff  Pulse (!) 104   Temp 98.3 F (36.8 C) (Oral)   Resp 20   LMP 12/31/2021   SpO2  98%   Visual Acuity Right Eye Distance:   Left Eye Distance:   Bilateral Distance:    Right Eye Near:   Left Eye Near:    Bilateral Near:     Physical Exam Vitals and nursing note reviewed.  Constitutional:      General: She is not in acute distress.    Appearance: She is not ill-appearing.  HENT:     Right Ear: Tympanic membrane normal.     Left Ear: Tympanic membrane normal.  Cardiovascular:     Rate and Rhythm: Normal rate and regular rhythm.     Pulses: Normal pulses.     Heart sounds: Normal heart sounds.  Pulmonary:     Effort: Pulmonary effort is normal.     Breath sounds: Normal breath sounds.  Abdominal:     General: Bowel sounds are normal.     Palpations: Abdomen is soft.  Neurological:     Mental Status: She is alert.      UC Treatments / Results  Labs (all labs ordered are listed, but only abnormal results are displayed) Labs Reviewed  POCT URINALYSIS DIPSTICK, ED / UC - Abnormal; Notable for the following components:      Result Value   Hgb urine dipstick SMALL (*)    Leukocytes,Ua SMALL (*)    All other components within normal limits  URINE CULTURE  POC URINE PREG, ED  CERVICOVAGINAL ANCILLARY ONLY    EKG   Radiology No results found.  Procedures Procedures (including critical care time)  Medications Ordered in UC Medications - No data to display  Initial Impression / Assessment and Plan / UC Course  I have reviewed the triage vital signs and the nursing notes.  Pertinent labs & imaging results that were available during my care of the patient were reviewed by me and considered in my medical decision making (see chart for details).     1.  Acute cystitis with hematuria: Point-of-care urinalysis is positive for hemoglobin and leukocyte Estrace Urine cultures have been sent Bactrim double strength 1 tablet twice daily for 3 days Cervicovaginal for vaginal yeast/bacterial vaginosis Increase oral fluid intake Will call patient with  recommendations if labs are abnormal Return precautions given. Final Clinical Impressions(s) / UC Diagnoses   Final diagnoses:  Acute cystitis with hematuria  Acute vaginitis  Discharge Instructions      Please increase oral fluid intake Will call you with recommendations if labs are abnormal Your urine is significant for urinary tract infection Urine cultures have been sent Please take antibiotics as prescribed Return to urgent care if symptoms worsen.   ED Prescriptions     Medication Sig Dispense Auth. Provider   sulfamethoxazole-trimethoprim (BACTRIM DS) 800-160 MG tablet Take 1 tablet by mouth 2 (two) times daily for 3 days. 6 tablet Fleming Prill, Britta Mccreedy, MD   fluconazole (DIFLUCAN) 150 MG tablet Take 1 tablet (150 mg total) by mouth once for 1 dose. These take second dose on day 3 if symptoms are not improving. 2 tablet Ava Tangney, Britta Mccreedy, MD      PDMP not reviewed this encounter.   Merrilee Jansky, MD 01/31/22 [phone]

## 2022-01-31 NOTE — ED Triage Notes (Signed)
Complains of uti, itchiness and redness to genital area.  Reports uti sx: frequent urination, bloating , abdominal and back pain, burning at the end of urinary stream.    Patient has been taking ibuprofen and took a stool softener to see if it would help

## 2022-01-31 NOTE — Discharge Instructions (Addendum)
Please increase oral fluid intake Will call you with recommendations if labs are abnormal Your urine is significant for urinary tract infection Urine cultures have been sent Please take antibiotics as prescribed Return to urgent care if symptoms worsen.

## 2022-02-01 LAB — CERVICOVAGINAL ANCILLARY ONLY
Bacterial Vaginitis (gardnerella): NEGATIVE
Candida Glabrata: NEGATIVE
Candida Vaginitis: NEGATIVE
Comment: NEGATIVE
Comment: NEGATIVE
Comment: NEGATIVE

## 2022-02-02 LAB — URINE CULTURE: Culture: 70000 — AB

## 2022-04-03 IMAGING — US US MFM OB FOLLOW-UP
1 series · 14 of 28 positions shown · non-contrast
Comparison: none

[Series 1: us mfm ob follow-up · 64 acquisitions, 14 frames shown]
[im 3/64]
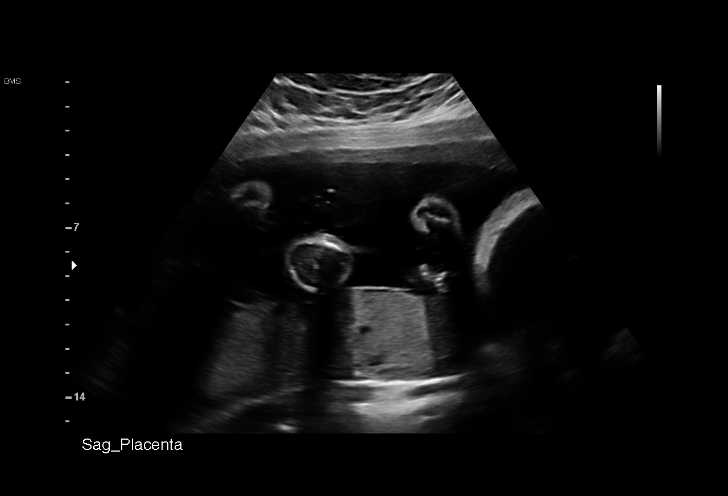
[im 8/64]
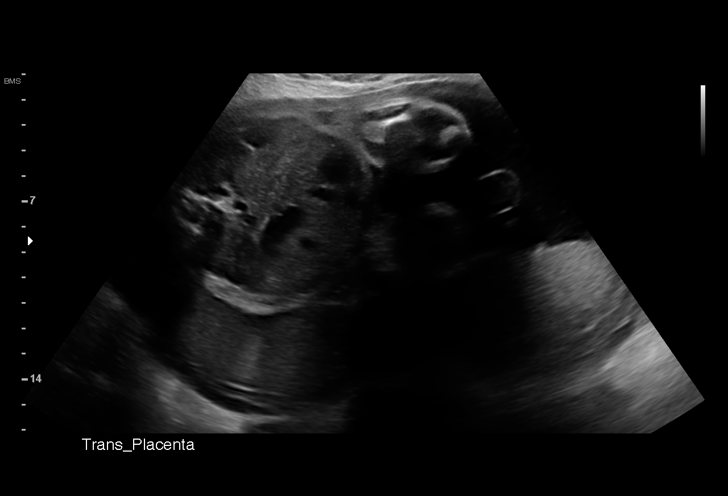
[im 12/64]
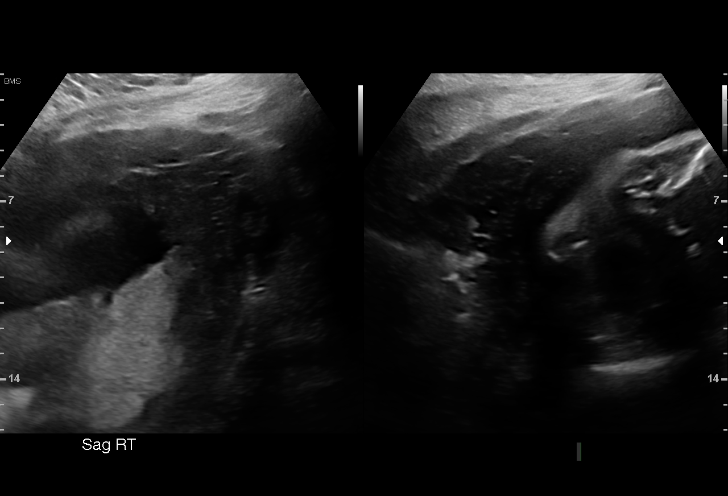
[im 17/64]
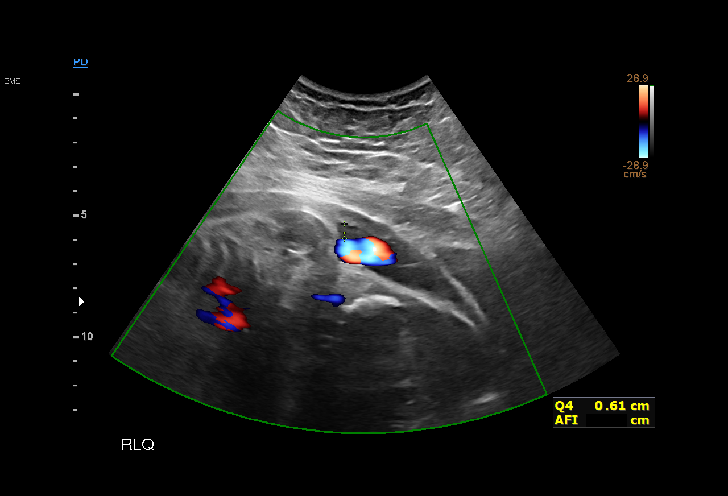
[im 22/64]
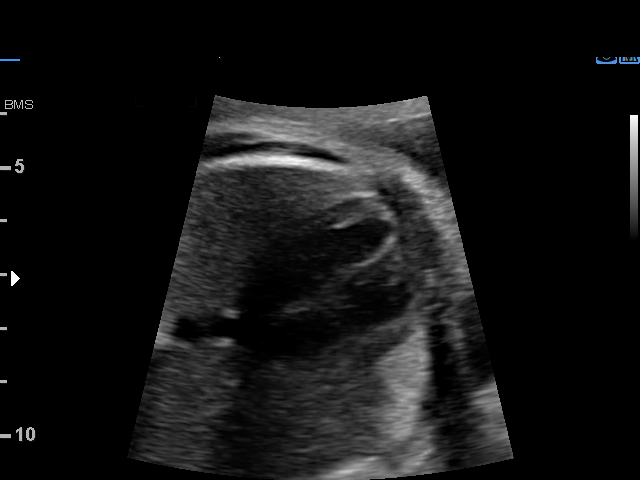
[im 26/64]
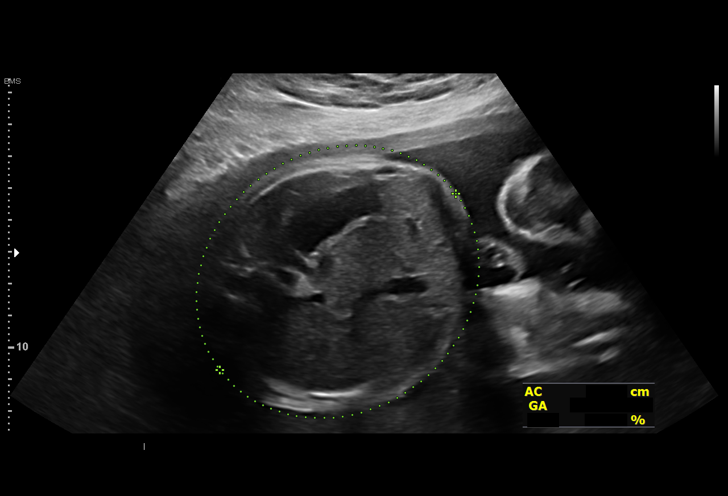
[im 31/64]
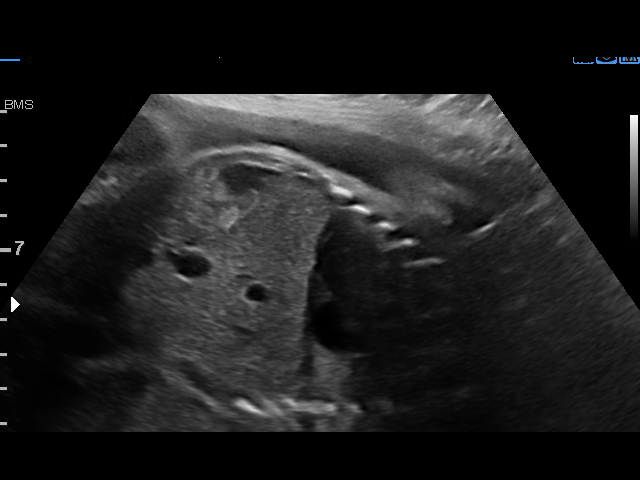
[im 36/64]
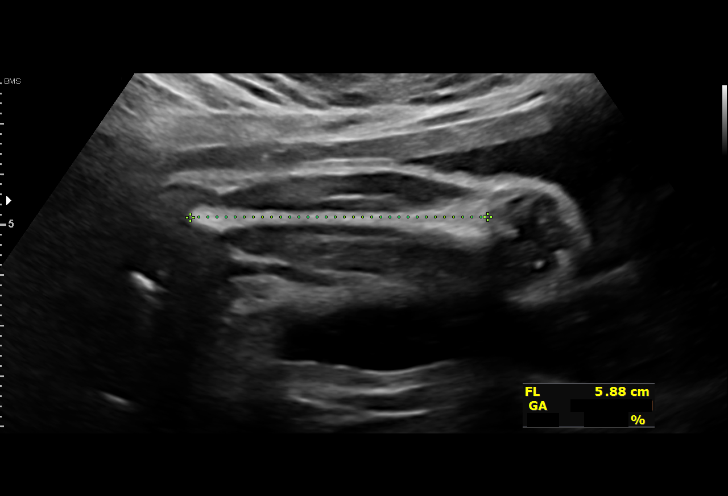
[im 40/64]
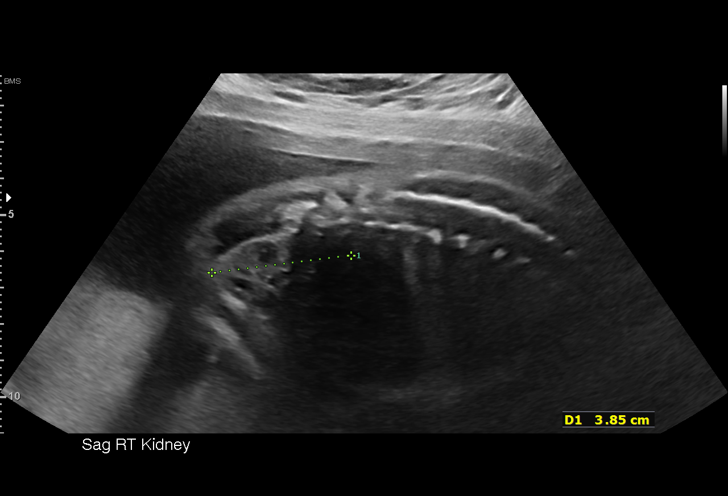
[im 45/64]
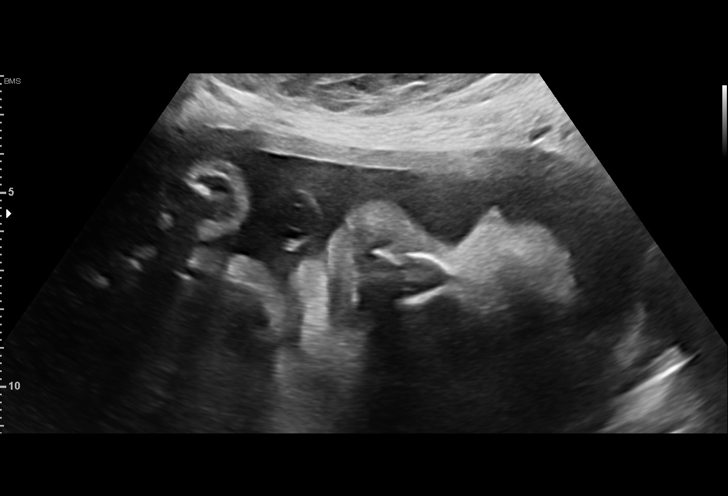
[im 50/64]
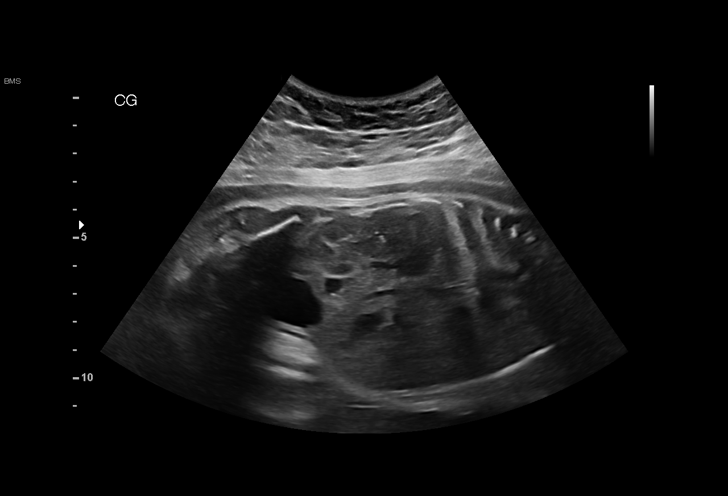
[im 54/64]
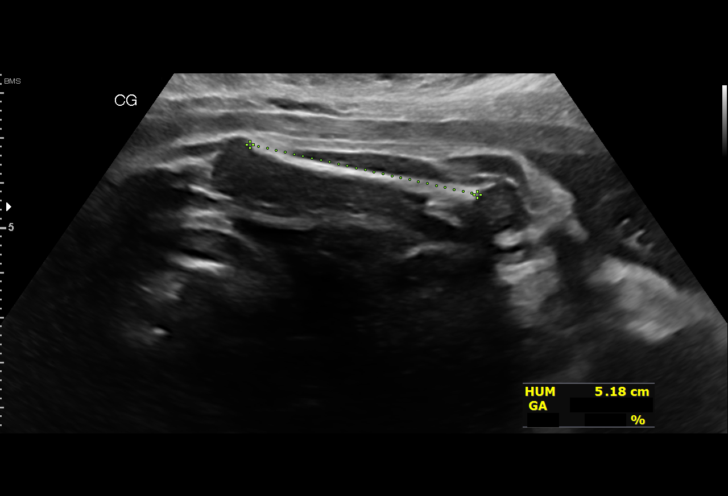
[im 59/64]
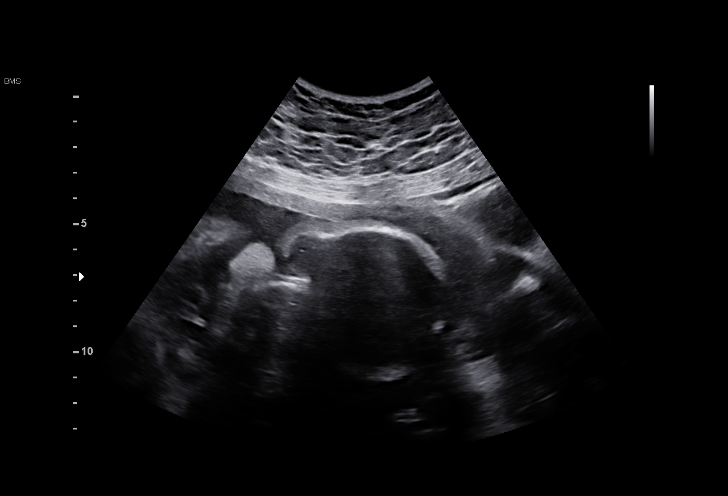
[im 64/64]
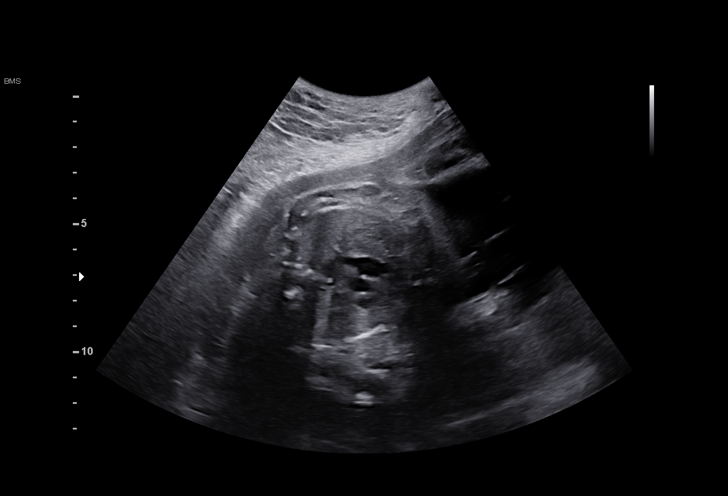

[14 of 28 positions shown; findings below may reference images not displayed]

Indications

 Obesity complicating pregnancy, second
 trimester (BMI 30)
 LR NIPS
 Rh negative state in antepartum
 31 weeks gestation of pregnancy
 Encounter for other antenatal screening
 follow-up
Fetal Evaluation

 Num Of Fetuses:         1
 Fetal Heart Rate(bpm):  140
 Cardiac Activity:       Observed
 Presentation:           Cephalic
 Placenta:               Posterior
 P. Cord Insertion:      Visualized, central

 Amniotic Fluid
 AFI FV:      Within normal limits

 AFI Sum(cm)     %Tile       Largest Pocket(cm)
 15.6            56

 RUQ(cm)       RLQ(cm)       LUQ(cm)        LLQ(cm)

Biometry

 BPD:        77  mm     G. Age:  30w 6d         35  %    CI:        71.74   %    70 - 86
                                                         FL/HC:      20.4   %    19.3 -
 HC:      289.4  mm     G. Age:  31w 6d         36  %    HC/AC:      1.07        0.96 -
 AC:      270.5  mm     G. Age:  31w 1d         51  %    FL/BPD:     76.6   %    71 - 87
 FL:         59  mm     G. Age:  30w 5d         30  %    FL/AC:      21.8   %    20 - 24
 LV:          4  mm
 Est. FW:    4313  gm    3 lb 12 oz      40  %
OB History

 Blood Type:   A-
 Gravidity:    1         Term:   0        Prem:   0        SAB:   0
 TOP:          0       Ectopic:  0        Living: 0
Gestational Age

 LMP:           31w 0d        Date:  07/12/19                 EDD:   04/17/20
 U/S Today:     31w 1d                                        EDD:   04/16/20
 Best:          31w 0d     Det. By:  LMP  (07/12/19)          EDD:   04/17/20
Anatomy

 Cranium:               Appears normal         LVOT:                   Previously seen
 Cavum:                 Previously seen        Aortic Arch:            Previously seen
 Ventricles:            Appears normal         Ductal Arch:            Previously seen
 Choroid Plexus:        Previously seen        Diaphragm:              Appears normal
 Cerebellum:            Previously seen        Stomach:                Appears normal, left
                                                                       sided
 Posterior Fossa:       Previously seen        Abdomen:                Previously seen
 Nuchal Fold:           Previously seen        Abdominal Wall:         Previously seen
 Face:                  Orbits and profile     Cord Vessels:           Previously seen
                        previously seen
 Lips:                  Previously seen        Kidneys:                Appear normal
 Palate:                Not well visualized    Bladder:                Appears normal
 Thoracic:              Previously seen        Spine:                  Previously seen
 Heart:                 Previously seen        Upper Extremities:      Previously seen
 RVOT:                  Previously seen        Lower Extremities:      Previously seen

 Other:  Fetus appears to be female. Heels appear normal. Technically
         difficult due to maternal habitus and fetal position.
Cervix Uterus Adnexa

 Cervix
 Not visualized (advanced GA >80wks)

 Uterus
 No abnormality visualized.

 Right Ovary
 Not visualized.

 Left Ovary
 Not visualized.

 Cul De Sac
 No free fluid seen.

 Adnexa
 No adnexal mass visualized.
Comments

 This patient was seen for a follow up growth scan as the
 overall EFW obtained during her last ultrasound exam was in
 the lower normal range.  She denies any problems since her
 last exam.
 She was informed that the fetal growth and amniotic fluid
 level appears appropriate for her gestational age.
 As the fetal growth is within normal limits, no further exams
 were scheduled in our office.

## 2022-08-06 ENCOUNTER — Encounter (HOSPITAL_BASED_OUTPATIENT_CLINIC_OR_DEPARTMENT_OTHER): Payer: Self-pay

## 2022-08-06 ENCOUNTER — Other Ambulatory Visit: Payer: Self-pay

## 2022-08-06 ENCOUNTER — Emergency Department (HOSPITAL_BASED_OUTPATIENT_CLINIC_OR_DEPARTMENT_OTHER)
Admission: EM | Admit: 2022-08-06 | Discharge: 2022-08-07 | Disposition: A | Discharge status: Home / Self Care | Payer: Managed Care, Other (non HMO) | Attending: Emergency Medicine | Admitting: Emergency Medicine

## 2022-08-06 DIAGNOSIS — H539 Unspecified visual disturbance: Secondary | ICD-10-CM

## 2022-08-06 DIAGNOSIS — H538 Other visual disturbances: Secondary | ICD-10-CM | POA: Diagnosis present

## 2022-08-06 NOTE — ED Provider Notes (Signed)
[facility] Provider Note   CSN: [id number] Arrival date & time: 08/06/22  2049     History  Chief Complaint  Patient presents with   Eye Problem    Kristy Scott is a 22 y.o. female.  Patient is a 22 year old female with history of nearsightedness presenting with complaints of visual disturbances.  She describes a 1 week history of decreased peripheral vision from her right eye.  She also describes seeing "orbs" in her line of sight from time to time.  She did have a chemical splashed into her eye 2 weeks ago, but she immediately flushed it and had no difficulties until 1 week ago.  She denies any eye pain.  Patient concerned about the possibility of a retinal detachment.  The history is provided by the patient.       Home Medications Prior to Admission medications   Medication Sig Start Date End Date Taking? Authorizing Provider  Blood Pressure Monitoring (BLOOD PRESSURE MONITOR AUTOMAT) DEVI 1 Device by Does not apply route daily. Automatic blood pressure cuff regular size. To monitor blood pressure regularly at home. ICD-10 code:Z34.90 11/01/19   [name], CNM  calcium carbonate (TUMS - DOSED IN MG ELEMENTAL CALCIUM) 500 MG chewable tablet Chew 1-2 tablets by mouth 4 (four) times daily as needed for indigestion or heartburn.    [provider]  chlorhexidine (HIBICLENS) 4 % external liquid Apply topically daily as needed. 08/10/20   Particia Nearing, PA-C  norethindrone (MICRONOR) 0.35 MG tablet Take 1 tablet (0.35 mg total) by mouth daily. Patient not taking: Reported on 01/31/2022 06/07/20   Rasch, Victorino Dike I, NP  Prenatal Vit-Fe Fumarate-FA (PRENATAL MULTIVITAMIN) TABS tablet Take 1 tablet by mouth daily at 12 noon.    [provider]  VYVANSE 10 MG capsule Take 10 mg by mouth every morning. 12/15/21   [provider]      Allergies    Penicillins and Amoxicillin    Review of Systems    Review of Systems  All other systems reviewed and are negative.   Physical Exam Updated Vital Signs BP (!) 145/96 (BP Location: Right Arm)   Pulse 97   Temp 98 F (36.7 C) (Oral)   Resp 14   Ht 5\' 5"  (1.651 m)   Wt 104.3 kg   LMP 07/17/2022 (Approximate)   SpO2 100%   BMI 38.27 kg/m  Physical Exam Vitals and nursing note reviewed.  Constitutional:      Appearance: Normal appearance.  Eyes:     Extraocular Movements: Extraocular movements intact.     Pupils: Pupils are equal, round, and reactive to light.     Comments: Funduscopic exam reveals no obvious abnormalities.  Pulmonary:     Effort: Pulmonary effort is normal.  Neurological:     Mental Status: She is alert and oriented to person, place, and time.     ED Results / Procedures / Treatments   Labs (all labs ordered are listed, but only abnormal results are displayed) Labs Reviewed - No data to display  EKG None  Radiology No results found.  Procedures Procedures    Medications Ordered in ED Medications - No data to display  ED Course/ Medical Decision Making/ A&P  Patient presenting with complaints of visual disturbances as described in the HPI.  I see no obvious findings on physical exam.  Patient will be referred to ophthalmology for follow-up.  To return as needed for any problems.  Final Clinical  Impression(s) / ED Diagnoses Final diagnoses:  None    Rx / DC Orders ED Discharge Orders     None         Geoffery Lyons, MD 08/06/22 2345

## 2022-08-06 NOTE — Discharge Instructions (Signed)
Follow-up with ophthalmology in the next 1 to 2 days.  The contact information for Dr. Essie Hart has been provided in this discharge summary for you to call and make these arrangements.

## 2022-08-06 NOTE — ED Triage Notes (Signed)
Pt  reports right eye pain/pressure and right peripheral vision dark areas/spots that started about a week ago.   Pt reports getting chemicals in her eyes about 2 weeks ago as well.   GCS 15. A/ox4 in triage. Denies headache, n/v/d.

## 2023-01-01 ENCOUNTER — Ambulatory Visit (INDEPENDENT_AMBULATORY_CARE_PROVIDER_SITE_OTHER): Payer: Managed Care, Other (non HMO) | Admitting: Family Medicine

## 2023-01-01 ENCOUNTER — Encounter: Payer: Self-pay | Admitting: Family Medicine

## 2023-01-01 VITALS — BP 120/83 | HR 80 | Temp 98.4°F | Resp 18 | Ht 65.0 in | Wt 233.1 lb

## 2023-01-01 DIAGNOSIS — Z23 Encounter for immunization: Secondary | ICD-10-CM | POA: Diagnosis not present

## 2023-01-01 DIAGNOSIS — Z111 Encounter for screening for respiratory tuberculosis: Secondary | ICD-10-CM | POA: Diagnosis not present

## 2023-01-01 DIAGNOSIS — F909 Attention-deficit hyperactivity disorder, unspecified type: Secondary | ICD-10-CM | POA: Diagnosis not present

## 2023-01-01 DIAGNOSIS — Z7689 Persons encountering health services in other specified circumstances: Secondary | ICD-10-CM | POA: Diagnosis not present

## 2023-01-01 NOTE — Progress Notes (Signed)
Established Patient Office Visit  Subjective   Patient ID: Kristy Scott, female    DOB: 2000/09/30  Age: 22 y.o. 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 She has hx of ADHD. Taking Vyvanse 30mg  daily. This is stable and controlled. 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 Will need this today along with TB blood screening test.    Review of Systems  All other systems reviewed and are negative.    Objective:     BP 120/83   Pulse 80   Temp 98.4 F (36.9 C) (Oral)   Resp 18   Ht 5\' 5"  (1.651 m)   Wt 233 lb 1.6 oz (105.7 kg)   LMP 12/11/2022 (Exact Date)   SpO2 97%   BMI 38.79 kg/m  BP Readings from Last 3 Encounters:  01/01/23 120/83  08/06/22 (!) 145/96  01/31/22 128/85      Physical Exam Vitals and nursing note reviewed.  Constitutional:      Appearance: Normal appearance. She is normal weight.  HENT:     Head: Normocephalic and atraumatic.     Right Ear: Tympanic membrane, ear canal and external ear normal.     Left Ear: Tympanic membrane, ear canal and external ear normal.     Nose: Nose normal.     Mouth/Throat:     Mouth: Mucous membranes are moist.     Pharynx: Oropharynx is clear.  Eyes:     Conjunctiva/sclera: Conjunctivae normal.     Pupils: Pupils are equal, round, and reactive to light.  Cardiovascular:     Rate and Rhythm: Normal rate and regular rhythm.     Pulses: Normal pulses.     Heart sounds: Normal heart sounds.  Pulmonary:     Effort: Pulmonary effort is normal.     Breath sounds: Normal breath sounds.  Abdominal:     General: Abdomen is flat. Bowel sounds are normal.  Musculoskeletal:        General: Normal range of motion.      Cervical back: Normal range of motion.  Skin:    General: Skin is warm.     Capillary Refill: Capillary refill takes less than 2 seconds.  Neurological:     General: No focal deficit present.     Mental Status: She is alert and oriented to person, place, and time. Mental status is at baseline.  Psychiatric:        Mood and Affect: Mood normal.        Behavior: Behavior normal.        Thought Content: Thought content normal.        Judgment: Judgment normal.    No results found for any visits on 01/01/23.  Last CBC Lab Results  Component Value Date   WBC 11.5 (H) 04/25/2020   HGB 12.6 04/25/2020   HCT 36.3 04/25/2020   MCV 86.8 04/25/2020   MCH 30.1 04/25/2020   RDW 13.2 04/25/2020   PLT 268 04/25/2020   Last metabolic panel Lab Results  Component Value Date   GLUCOSE 102 (H) 12/11/2018   NA 135 12/11/2018   K 3.9 12/11/2018  CL 100 12/11/2018   CO2 19 (L) 12/11/2018   BUN 5 (L) 12/11/2018   CREATININE 0.92 12/11/2018   GFRNONAA >60 12/11/2018   CALCIUM 9.1 12/11/2018   PROT 6.9 12/11/2018   ALBUMIN 3.7 12/11/2018   BILITOT 1.0 12/11/2018   ALKPHOS 64 12/11/2018   AST 11 (L) 12/11/2018   ALT 16 12/11/2018   ANIONGAP 16 (H) 12/11/2018      The ASCVD Risk score (Arnett DK, et al., 2019) failed to calculate for the following reasons:   The 2019 ASCVD risk score is only valid for ages 50 to 22    Assessment & Plan:   Problem List Items Addressed This Visit   None  Encounter to establish care with new doctor  Need for influenza vaccination -     Flu vaccine trivalent PF, 6mos and older(Flulaval,Afluria,Fluarix,Fluzone)  Screening-pulmonary TB -     QuantiFERON-TB Gold Plus  Attention deficit hyperactivity disorder (ADHD), unspecified ADHD type   Flu vaccine today TB blood test Physical form completed today and returned to pt today at the end of the visit. To continue routine follow up for ADHD.  No follow-ups on file.    Suzan Slick, MD

## 2023-01-06 LAB — QUANTIFERON-TB GOLD PLUS
QuantiFERON Mitogen Value: >10 [IU]/mL
QuantiFERON Nil Value: 0.01 [IU]/mL
QuantiFERON TB1 Ag Value: 0.02 [IU]/mL
QuantiFERON TB2 Ag Value: 0.02 [IU]/mL
QuantiFERON-TB Gold Plus: NEGATIVE

## 2023-01-23 ENCOUNTER — Encounter: Payer: Self-pay | Admitting: Family Medicine

## 2023-01-23 ENCOUNTER — Ambulatory Visit: Payer: BC Managed Care – PPO | Admitting: Family Medicine

## 2023-01-23 DIAGNOSIS — Z0184 Encounter for antibody response examination: Secondary | ICD-10-CM

## 2023-01-25 LAB — MEASLES/MUMPS/RUBELLA IMMUNITY
MUMPS ABS, IGG: 10.4 [AU]/ml — ABNORMAL LOW (ref >10.9)
RUBEOLA AB, IGG: >300 [AU]/ml (ref >16.4)
Rubella Antibodies, IGG: 3.78 {index} (ref >0.99)

## 2023-01-25 LAB — VARICELLA ZOSTER ANTIBODY, IGG: Varicella zoster IgG: REACTIVE

## 2023-01-25 LAB — HEPATITIS B SURFACE ANTIBODY,QUALITATIVE: Hep B Surface Ab, Qual: NONREACTIVE

## 2023-01-31 ENCOUNTER — Encounter: Payer: Self-pay | Admitting: Family Medicine

## 2023-01-31 ENCOUNTER — Ambulatory Visit: Payer: BC Managed Care – PPO

## 2023-02-10 ENCOUNTER — Ambulatory Visit: Payer: Managed Care, Other (non HMO) | Admitting: Family Medicine

## 2023-02-10 ENCOUNTER — Encounter: Payer: Self-pay | Admitting: Family Medicine

## 2023-02-10 VITALS — BP 117/80 | HR 79 | Temp 98.0°F | Resp 18 | Ht 65.0 in | Wt 288.3 lb

## 2023-02-10 DIAGNOSIS — Z23 Encounter for immunization: Secondary | ICD-10-CM | POA: Diagnosis not present

## 2023-02-10 NOTE — Progress Notes (Signed)
   Subjective:    Patient ID: Kristy Scott, female    DOB: 2000/06/20, 23 y.o.   MRN: [id number]  HPI Patient is here to get her MMR immunizations    Review of Systems     Objective:   Physical Exam        Assessment & Plan:   Patient tolerated injection well

## 2023-02-26 ENCOUNTER — Ambulatory Visit: Payer: BC Managed Care – PPO | Admitting: Family Medicine

## 2023-03-10 ENCOUNTER — Telehealth: Payer: Self-pay

## 2023-03-10 NOTE — Telephone Encounter (Signed)
Copied from CRM [id number]. Topic: Clinical - Request for Lab/Test Order >> Mar 10, 2023  9:25 AM Dennison Nancy wrote: Reason for CRM: Patient requesting a nurse visit to get Hep B and the Mumps Measle rubella injection

## 2023-03-11 ENCOUNTER — Encounter: Payer: Self-pay | Admitting: Family Medicine

## 2023-03-11 ENCOUNTER — Ambulatory Visit (INDEPENDENT_AMBULATORY_CARE_PROVIDER_SITE_OTHER): Payer: Managed Care, Other (non HMO)

## 2023-03-11 VITALS — BP 113/75 | HR 87 | Temp 98.2°F | Resp 18 | Ht 65.0 in | Wt 236.4 lb

## 2023-03-11 DIAGNOSIS — Z23 Encounter for immunization: Secondary | ICD-10-CM

## 2023-03-11 NOTE — Progress Notes (Signed)
   Subjective:    Patient ID: Kristy Scott, female    DOB: 2000/04/13, 23 y.o.   MRN: [id number]  HPI Patient is here for her second Hep B and MMR vaccines   Review of Systems     Objective:   Physical Exam        Assessment & Plan:  Patient tolerated both injections well

## 2023-03-14 ENCOUNTER — Ambulatory Visit: Payer: BC Managed Care – PPO | Admitting: Obstetrics & Gynecology

## 2023-03-14 ENCOUNTER — Encounter: Payer: Self-pay | Admitting: Obstetrics & Gynecology

## 2023-04-09 ENCOUNTER — Encounter: Payer: Self-pay | Admitting: Family Medicine

## 2023-04-09 DIAGNOSIS — Z0184 Encounter for antibody response examination: Secondary | ICD-10-CM

## 2023-04-18 LAB — MEASLES/MUMPS/RUBELLA IMMUNITY
MUMPS ABS, IGG: 259 [AU]/ml (ref >10.9)
RUBEOLA AB, IGG: >300 [AU]/ml (ref >16.4)
Rubella Antibodies, IGG: 9.21 {index} (ref >0.99)

## 2024-01-09 ENCOUNTER — Inpatient Hospital Stay (HOSPITAL_COMMUNITY)
Admission: AD | Admit: 2024-01-09 | Discharge: 2024-01-09 | Disposition: A | Discharge status: Home / Self Care | Attending: Family Medicine | Admitting: Family Medicine

## 2024-01-09 ENCOUNTER — Inpatient Hospital Stay (HOSPITAL_COMMUNITY)

## 2024-01-09 ENCOUNTER — Encounter (HOSPITAL_COMMUNITY): Payer: Self-pay | Admitting: Family Medicine

## 2024-01-09 ENCOUNTER — Other Ambulatory Visit: Payer: Self-pay

## 2024-01-09 ENCOUNTER — Ambulatory Visit: Payer: Self-pay

## 2024-01-09 DIAGNOSIS — E860 Dehydration: Secondary | ICD-10-CM

## 2024-01-09 DIAGNOSIS — Z3A00 Weeks of gestation of pregnancy not specified: Secondary | ICD-10-CM | POA: Diagnosis not present

## 2024-01-09 DIAGNOSIS — O3680X0 Pregnancy with inconclusive fetal viability, not applicable or unspecified: Secondary | ICD-10-CM

## 2024-01-09 DIAGNOSIS — O209 Hemorrhage in early pregnancy, unspecified: Secondary | ICD-10-CM

## 2024-01-09 LAB — URINALYSIS, ROUTINE W REFLEX MICROSCOPIC
Glucose, UA: NEGATIVE mg/dL
Ketones, ur: 40 mg/dL — AB
Leukocytes,Ua: NEGATIVE
Nitrite: NEGATIVE
Protein, ur: NEGATIVE mg/dL
Specific Gravity, Urine: >1.03 — ABNORMAL HIGH (ref 1.005–1.030)
pH: 6 (ref 5.0–8.0)

## 2024-01-09 LAB — CBC
HCT: 42.5 % (ref 36.0–46.0)
Hemoglobin: 14.3 g/dL (ref 12.0–15.0)
MCH: 29.2 pg (ref 26.0–34.0)
MCHC: 33.6 g/dL (ref 30.0–36.0)
MCV: 86.7 fL (ref 80.0–100.0)
Platelets: 264 K/uL (ref 150–400)
RBC: 4.9 MIL/uL (ref 3.87–5.11)
RDW: 12.4 % (ref 11.5–15.5)
WBC: 7.4 K/uL (ref 4.0–10.5)
nRBC: 0 % (ref 0.0–0.2)

## 2024-01-09 LAB — WET PREP, GENITAL
Clue Cells Wet Prep HPF POC: NONE SEEN
Sperm: NONE SEEN
Trich, Wet Prep: NONE SEEN
WBC, Wet Prep HPF POC: <10 (ref <10)
Yeast Wet Prep HPF POC: NONE SEEN

## 2024-01-09 LAB — URINALYSIS, MICROSCOPIC (REFLEX)

## 2024-01-09 LAB — HCG, QUANTITATIVE, PREGNANCY: hCG, Beta Chain, Quant, S: 20 m[IU]/mL — ABNORMAL HIGH (ref <5)

## 2024-01-09 NOTE — MAU Provider Note (Cosign Needed Addendum)
 Chief Complaint:  Vaginal Bleeding and Diarrhea   HPI   None     Kristy Scott is a 23 y.o. G2P1001 at Unknown who presents to maternity admissions reporting vaginal bleeding since last night at 1800. She additionally reports diarrhea 4x, and continues to have abdominal pain/cramping since. Positive UPT.  LMP unclear - early to mid October.  Pain is 4/10.   Pregnancy Course: Has not yet established care  Past Medical History:  Diagnosis Date   ADHD    OB History  Gravida Para Term Preterm AB Living  2 1 1   1   SAB IAB Ectopic Multiple Live Births     0 1    # Outcome Date GA Lbr Len/2nd Weight Sex Type Anes PTL Lv  2 Current           1 Term 04/26/20 [redacted]w[redacted]d 14:15 / 00:26 3289 g F Vag-Spont EPI  LIV   Past Surgical History:  Procedure Laterality Date   NO PAST SURGERIES     No family history on file. Social History   Tobacco Use   Smoking status: Never    Passive exposure: Never   Smokeless tobacco: Never  Vaping Use   Vaping status: Never Used  Substance Use Topics   Alcohol use: Yes    Comment: soc   Drug use: Never    Comment: stopped 3 months ago   Allergies  Allergen Reactions   Penicillins Hives   Amoxicillin Hives    No history of taking cephalosporins   No medications prior to admission.    I have reviewed patient's Past Medical Hx, Surgical Hx, Family Hx, Social Hx, medications and allergies.   ROS  Pertinent items noted in HPI and remainder of comprehensive ROS otherwise negative.   PHYSICAL EXAM  No data found.   Physical Exam Vitals and nursing note reviewed.  Constitutional:      General: She is not in acute distress.    Appearance: Normal appearance. She is not ill-appearing, toxic-appearing or diaphoretic.  HENT:     Head: Normocephalic.  Cardiovascular:     Rate and Rhythm: Normal rate.     Pulses: Normal pulses.  Pulmonary:     Effort: Pulmonary effort is normal.  Skin:    General: Skin is warm and dry.     Capillary  Refill: Capillary refill takes less than 2 seconds.  Neurological:     General: No focal deficit present.     Mental Status: She is alert and oriented to person, place, and time.  Psychiatric:        Mood and Affect: Mood normal.        Behavior: Behavior normal.        Thought Content: Thought content normal.        Judgment: Judgment normal.        Labs: No results found for this or any previous visit (from the past 24 hours).   Imaging:  No results found.   MDM & MAU COURSE  MDM:  Moderate  MAU Course:  CUBA workup to due PUL and bleeding. Hcg 20, no IUP. Overall reassuring for non-ectopic.  Reviewed with Dr. Fredirick - agreed that  return for trending HCG on 01/11/24 appropriate.  Ddx: ectopic, miscarriage, or viable pregnancy simply too early to see on US . Favor miscarriage given symptoms and LMP likely early to mid October, symptoms and low Hcg.    Orders Placed This Encounter  Procedures   Wet prep, genital  US  OB LESS THAN 14 WEEKS WITH OB TRANSVAGINAL   Urinalysis, Routine w reflex microscopic -Urine, Clean Catch   CBC   hCG, quantitative, pregnancy   Urinalysis, Microscopic (reflex)   Rh IG workup (includes ABO/Rh)   Discharge patient   No orders of the defined types were placed in this encounter.   ASSESSMENT   1. Bleeding in early pregnancy   2. Pregnancy of unknown anatomic location   3. Dehydration     PLAN  Discharge home in stable condition.  Return to MAU 01/11/24, for follow up HCG. Appointment scheduled.  Strict return precautions advised.    Follow-up Information     [facility] Follow up on 01/11/2024.   Specialty: Obstetrics and Gynecology Contact information: [address] [place]  [id number] [phone]                Allergies as of 01/09/2024       Reactions   Penicillins Hives   Amoxicillin Hives   No history of taking cephalosporins        Medication List     TAKE  these medications    amphetamine-dextroamphetamine 5 MG 24 hr capsule Commonly known as: ADDERALL XR Take 5 mg by mouth daily.   lisdexamfetamine 30 MG capsule Commonly known as: VYVANSE Take 30 mg by mouth every morning.        [name] Rote, MSN, CNM 01/10/2024 3:54 PM  Certified Nurse Midwife, [facility]

## 2024-01-09 NOTE — Discharge Instructions (Signed)
 Kristy Scott,  You have a positive pregnancy test with a low level of hormone present. To determine what is happening with your pregnancy, you should be seen in MAU on Sunday for a repeat pregnancy hormone level. This will help determine if this is a normally progressing pregnancy.   Please return sooner if you have heavy bleeding, filling a pad in less than an hour especially if you feel dizzy or lightheaded or have symptoms of infection such as a foul odor or running a temperature of 101.4 or greater.   Your panorama was low risk for genetic abnormalities for the fetus (good news!). If you open this result, you will see the sex of the baby. If you do not wish to know the sex of the baby, do not open the result labelled Panorama.   Thank you for trusting us  to care for you, [name], Midwife

## 2024-01-09 NOTE — MAU Note (Addendum)
 Kristy Scott is a 23 y.o. at Unknown here in MAU reporting: VB that started last night at 1800 and 4 time of diarrhea in last 24 hours.  Abd cramping lower middle that starts with diarrhea. No UTI symptoms  Last intercourse 3 days ago Pos HPT and POS preg test from PP results visualized LMP: unknown- maybe beginning of OCT Onset of complaint: 1800 Pain score: 4/10 Vitals:   01/09/24 1128  BP: (!) 147/83  Pulse: 99  Resp: 17  Temp: 98.8 F (37.1 C)  SpO2: 98%     FHT: na  Lab orders placed from triage: ua

## 2024-01-09 NOTE — Telephone Encounter (Signed)
 FYI Only or Action Required?: FYI only for provider: ED advised.  Patient was last seen in primary care on 03/11/2023 by Colette Torrence GRADE, MD.  Called Nurse Triage reporting Vaginal Bleeding. -Pt thinks she is pregnant. Lmp mid October but unsure.  Symptoms began yesterday.  Interventions attempted: Nothing.  Symptoms are: unchanged.  Triage Disposition: Go to ED Now (Notify PCP)  Patient/caregiver understands and will follow disposition?: yes                        Copied from CRM [id number]. Topic: Clinical - Red Word Triage >> Jan 09, 2024 10:37 AM Mercer PEDLAR wrote: Red Word that prompted transfer to Nurse Triage: Patient stated she is pregnant and started heavily bleeding last night. Reason for Disposition  MODERATE vaginal bleeding (e.g., soaking 1 pad or tampon per hour and present > 6 hours; 1 menstrual cup every 6 hours)  Answer Assessment - Initial Assessment Questions 1. ONSET: When did this bleeding start?       Last night 2. BLEEDING SEVERITY: Describe the bleeding that you are having. How much bleeding is there?      Moderate- heavy - like a heavy period 3. ABDOMEN PAIN: Do you have any pain? How bad is the pain?  (e.g., Scale 0-10; none, mild, moderate, or severe)     Cramping  4. PREGNANCY: Do you know how many weeks or months pregnant you are? When was the first day of your last normal menstrual period?     Unsure lmp in october 5. ULTRASOUND: Have you had an ultrasound during this pregnancy?  Note: To confirm intrauterine pregnancy, placenta location.     no 6. HEMODYNAMIC STATUS: Are you weak or feeling lightheaded? If Yes, ask: Can you stand and walk normally?      Pt feels fine 7. OTHER SYMPTOMS: What other symptoms are you having with the bleeding? (e.g., passed tissue, vaginal discharge, fever, menstrual-type cramps)     100.1 tepertaure - diarrhea  Protocols used: Pregnancy - Vaginal Bleeding Less Than [redacted] Weeks  EGA-A-AH

## 2024-01-11 ENCOUNTER — Inpatient Hospital Stay (HOSPITAL_COMMUNITY)
Admit: 2024-01-11 | Discharge: 2024-01-11 | Disposition: A | Payer: Self-pay | Attending: Obstetrics and Gynecology | Admitting: Obstetrics and Gynecology

## 2024-01-11 ENCOUNTER — Inpatient Hospital Stay (HOSPITAL_COMMUNITY): Attending: Family Medicine

## 2024-01-11 ENCOUNTER — Encounter: Payer: Self-pay | Admitting: Certified Nurse Midwife

## 2024-01-11 ENCOUNTER — Other Ambulatory Visit: Payer: Self-pay

## 2024-01-11 DIAGNOSIS — O039 Complete or unspecified spontaneous abortion without complication: Secondary | ICD-10-CM

## 2024-01-11 DIAGNOSIS — N939 Abnormal uterine and vaginal bleeding, unspecified: Secondary | ICD-10-CM

## 2024-01-11 LAB — CBC WITH DIFFERENTIAL/PLATELET
Abs Immature Granulocytes: 0.03 K/uL (ref 0.00–0.07)
Basophils Absolute: 0 K/uL (ref 0.0–0.1)
Basophils Relative: 1 %
Eosinophils Absolute: 0.1 K/uL (ref 0.0–0.5)
Eosinophils Relative: 1 %
HCT: 43.2 % (ref 36.0–46.0)
Hemoglobin: 15 g/dL (ref 12.0–15.0)
Immature Granulocytes: 1 %
Lymphocytes Relative: 23 %
Lymphs Abs: 1.4 K/uL (ref 0.7–4.0)
MCH: 29.2 pg (ref 26.0–34.0)
MCHC: 34.7 g/dL (ref 30.0–36.0)
MCV: 84.2 fL (ref 80.0–100.0)
Monocytes Absolute: 0.6 K/uL (ref 0.1–1.0)
Monocytes Relative: 10 %
Neutro Abs: 4 K/uL (ref 1.7–7.7)
Neutrophils Relative %: 64 %
Platelets: 263 K/uL (ref 150–400)
RBC: 5.13 MIL/uL — ABNORMAL HIGH (ref 3.87–5.11)
RDW: 12.4 % (ref 11.5–15.5)
WBC: 6.2 K/uL (ref 4.0–10.5)
nRBC: 0 % (ref 0.0–0.2)

## 2024-01-11 LAB — COMPREHENSIVE METABOLIC PANEL WITH GFR
ALT: 19 U/L (ref 0–44)
AST: 20 U/L (ref 15–41)
Albumin: 3.8 g/dL (ref 3.5–5.0)
Alkaline Phosphatase: 87 U/L (ref 38–126)
Anion gap: 9 (ref 5–15)
BUN: 10 mg/dL (ref 6–20)
CO2: 21 mmol/L — ABNORMAL LOW (ref 22–32)
Calcium: 8.3 mg/dL — ABNORMAL LOW (ref 8.9–10.3)
Chloride: 108 mmol/L (ref 98–111)
Creatinine, Ser: 0.76 mg/dL (ref 0.44–1.00)
GFR, Estimated: >60 mL/min (ref >60)
Glucose, Bld: 102 mg/dL — ABNORMAL HIGH (ref 70–99)
Potassium: 3.8 mmol/L (ref 3.5–5.1)
Sodium: 138 mmol/L (ref 135–145)
Total Bilirubin: 0.6 mg/dL (ref 0.0–1.2)
Total Protein: 6.6 g/dL (ref 6.5–8.1)

## 2024-01-11 LAB — HCG, QUANTITATIVE, PREGNANCY: hCG, Beta Chain, Quant, S: 4 m[IU]/mL (ref <5)

## 2024-01-11 NOTE — MAU Provider Note (Signed)
 None     S Ms. Kristy Scott is a 23 y.o. G38P1001 pregnant female at Unknown who presents to MAU today with complaint of follow up beta Hcg after CUBA workup 01/09/24. She reports abdominal pain has subsided, and bleeding stopped this morning.   Has not initiated care yet.   Pertinent items noted in HPI and remainder of comprehensive ROS otherwise negative.   O BP (!) 144/99 (BP Location: Right Arm)   Pulse (!) 104   Temp 98.3 F (36.8 C) (Oral)   Resp 17   SpO2 98%  Physical Exam Vitals reviewed.  Constitutional:      General: She is not in acute distress.    Appearance: Normal appearance. She is not ill-appearing, toxic-appearing or diaphoretic.  HENT:     Head: Normocephalic.  Cardiovascular:     Rate and Rhythm: Normal rate.     Pulses: Normal pulses.  Pulmonary:     Effort: Pulmonary effort is normal.  Skin:    General: Skin is warm and dry.     Capillary Refill: Capillary refill takes less than 2 seconds.  Neurological:     General: No focal deficit present.     Mental Status: She is alert and oriented to person, place, and time.  Psychiatric:        Mood and Affect: Mood normal.        Behavior: Behavior normal.        Thought Content: Thought content normal.        Judgment: Judgment normal.      MDM:  Low  MAU Course:  Patient with CUBA workup on 01/09/24. Follow up quant as dictated by Hcg 20 and no IUP. Quant now 4. Patient no longer pregnant, miscarriage complete. Consulted Dr. Nicholaus who agrees reasonable to have patient scheduled for IUD placement as desired.   A  Abnormal uterine bleeding (AUB) - Plan: Discharge patient  Miscarriage  Medical screening exam complete  P  Discharge from MAU in stable condition with routine precautions Follow up at [facility] as scheduled IUD placement as desired. Scheduled 01/15/24, patient notified via MyChart.    Allergies as of 01/11/2024       Reactions   Penicillins Hives   Amoxicillin Hives   No history  of taking cephalosporins        Medication List     TAKE these medications    amphetamine-dextroamphetamine 5 MG 24 hr capsule Commonly known as: ADDERALL XR Take 5 mg by mouth daily.   lisdexamfetamine 30 MG capsule Commonly known as: VYVANSE Take 30 mg by mouth every morning.        [name] Rote, MSN, CNM 01/11/2024 5:18 PM  Certified Nurse Midwife, [facility]

## 2024-01-11 NOTE — MAU Note (Signed)
 Kristy Scott is a 23 y.o. at Unknown here in MAU reporting: here for repeat bhcg States the vaginal bleeding has stopped. Denies any pain. Overall feeling well.    Pain score: 0 Vitals:   01/11/24 1532  BP: (!) 144/99  Pulse: (!) 104  Resp: 17  Temp: 98.3 F (36.8 C)  SpO2: 98%     Lab orders placed from triage:  bhcg

## 2024-01-12 LAB — GC/CHLAMYDIA PROBE AMP (~~LOC~~) NOT AT ARMC
Chlamydia: NEGATIVE
Comment: NEGATIVE
Comment: NORMAL
Neisseria Gonorrhea: NEGATIVE

## 2024-01-13 LAB — RH IG WORKUP (INCLUDES ABO/RH)
ABO/RH(D): A NEG
Antibody Screen: NEGATIVE
Gestational Age(Wks): 6
Unit division: 0

## 2024-01-14 NOTE — Progress Notes (Signed)
° ° °  IUD INSERTION PROCEDURE NOTE  Kristy Scott is a 23 y.o. G2P1001 here for Paragard IUD insertion. No GYN concerns.   She was counseled regarding the risks/benefits of IUD including insertion risk of infection, hemorrhage, damage to surrounding tissue and organs, uterine perforation. She was counseled regarding risks of IUD including implantation into uterine wall, migration outside of uterus, possible need for hysteroscopic or laparoscopic removal, ovarian cysts, expulsion. She was advised that risk of pregnancy is low with negative UPT but is not zero and IUD insertion may cause miscarriage. Reviewed that she is also at slightly higher risk for ectopic pregnancy and she should take a pregnancy test if she believes she may be pregnant. She was advised to use backup method of protection for one week. She verbalized understanding of all of the above and consent signed.   Patient recently has SAB, and has not had intercourse since.  Has not yet had pap UPT today: deferred with reasonably certain not pregnant.   IUD Insertion  Patient identified and an adequate time out was performed. Speculum placed in the vagina. The cervix was cleaned with Betadine x 2 and grasped anteriorly with a single tooth tenaculum.  A uterine sound was used to sound the uterus to 7 cm;  the IUD was then placed per manufacturer's recommendations. Strings trimmed to 3 cm. Tenaculum was removed, good hemostasis noted. Patient tolerated procedure well.   Patient was given post-procedure instructions.  She was reminded to have backup contraception for one week during this transition period between IUDs.  Patient was also asked to check IUD strings periodically and follow up in 4 weeks for IUD check.  Paragard IUD Exp: 8/27 Lot: [id number]  [name] LABOR [place]  01/15/2024 1:17 PM

## 2024-01-15 ENCOUNTER — Other Ambulatory Visit: Payer: Self-pay

## 2024-01-15 ENCOUNTER — Ambulatory Visit: Payer: Self-pay | Admitting: Certified Nurse Midwife

## 2024-01-15 ENCOUNTER — Encounter: Payer: Self-pay | Admitting: Certified Nurse Midwife

## 2024-01-15 DIAGNOSIS — Z975 Presence of (intrauterine) contraceptive device: Secondary | ICD-10-CM | POA: Insufficient documentation

## 2024-01-15 DIAGNOSIS — Z30014 Encounter for initial prescription of intrauterine contraceptive device: Secondary | ICD-10-CM

## 2024-01-15 DIAGNOSIS — Z3009 Encounter for other general counseling and advice on contraception: Secondary | ICD-10-CM

## 2024-01-15 MED ADMIN — Copper IUD: 1 | INTRAUTERINE | NDC 59365512901

## 2024-02-17 ENCOUNTER — Ambulatory Visit: Payer: Self-pay | Admitting: Certified Nurse Midwife

## 2024-03-08 ENCOUNTER — Encounter: Payer: Self-pay | Admitting: Family Medicine

## 2024-04-01 ENCOUNTER — Ambulatory Visit: Admitting: Family Medicine
